# Patient Record
Sex: Female | Born: 2000 | Hispanic: Yes | Smoking: Never smoker
Health system: Southern US, Community
[De-identification: ages and names within clinical notes are randomized; demographics above are authoritative.]

## PROBLEM LIST (undated history)

## (undated) ENCOUNTER — Emergency Department (HOSPITAL_COMMUNITY): Admission: EM | Payer: Medicaid Other | Source: Home / Self Care

## (undated) DIAGNOSIS — Z789 Other specified health status: Secondary | ICD-10-CM

## (undated) HISTORY — DX: Other specified health status: Z78.9

## (undated) HISTORY — PX: WISDOM TOOTH EXTRACTION: SHX21

---
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
## 2018-03-18 NOTE — L&D Delivery Note (Signed)
Kylie Wallace is a 18 y.o. female G1P0000 with IUP at [redacted]w[redacted]d admitted for SROM.  She progressed with pitocin augmentation to complete and pushed 3 times to deliver.  Cord clamping delayed by several minutes then clamped by CNM and cut by FOB.    Delivery Note At 5:58 PM a viable female was delivered via Vaginal, Spontaneous (Presentation: LOA).  APGAR: 8, 9; weight pending .  With delivery of head, nuchal cord x2 noted, delivered through and easily reduced after delivery of body.  Placenta status: spontaneous, with trailing membranes delivered intact.  Cord: 3 vessels with the following complications: nuchal x2, marginal cord insertion.   Anesthesia: local lidocaine  Episiotomy: None Lacerations: 1st degree;Perineal Suture Repair: 3.0 vicryl Est. Blood Loss (mL): 75   Mom to postpartum.  Baby to Couplet care / Skin to Skin.  Wende Mott CNM 10/10/2018, 6:38 PM

## 2018-04-28 ENCOUNTER — Other Ambulatory Visit (HOSPITAL_COMMUNITY)
Admission: RE | Admit: 2018-04-28 | Discharge: 2018-04-28 | Disposition: A | Discharge status: Home / Self Care | Payer: Medicaid Other | Source: Ambulatory Visit | Attending: Student | Admitting: Student

## 2018-04-28 ENCOUNTER — Ambulatory Visit: Payer: Medicaid Other | Admitting: Clinical

## 2018-04-28 ENCOUNTER — Encounter: Payer: Self-pay | Admitting: *Deleted

## 2018-04-28 ENCOUNTER — Other Ambulatory Visit: Payer: Self-pay

## 2018-04-28 ENCOUNTER — Ambulatory Visit (INDEPENDENT_AMBULATORY_CARE_PROVIDER_SITE_OTHER): Payer: Medicaid Other | Admitting: *Deleted

## 2018-04-28 VITALS — BP 119/78 | HR 88 | Wt 161.5 lb

## 2018-04-28 DIAGNOSIS — Z3400 Encounter for supervision of normal first pregnancy, unspecified trimester: Secondary | ICD-10-CM | POA: Insufficient documentation

## 2018-04-28 LAB — POCT URINALYSIS DIP (DEVICE)
Bilirubin Urine: NEGATIVE
GLUCOSE, UA: NEGATIVE mg/dL
Hgb urine dipstick: NEGATIVE
Ketones, ur: NEGATIVE mg/dL
NITRITE: NEGATIVE
PH: 6.5 (ref 5.0–8.0)
PROTEIN: NEGATIVE mg/dL
Specific Gravity, Urine: <=1.005 (ref 1.005–1.030)
UROBILINOGEN UA: 0.2 mg/dL (ref 0.0–1.0)

## 2018-04-28 NOTE — Progress Notes (Signed)
Chart reviewed for nurse visit. Agree with plan of care.   Marylene Land, CNM 04/28/2018 1:55 PM

## 2018-04-28 NOTE — Progress Notes (Signed)
New Ob intake completed and pregnancy information packet given. Pt has no c/o. Labs drawn and Medication Home form completed. [place] for anatomy scheduled. Initial prenatal visit scheduled on 05/05/18.

## 2018-04-28 NOTE — BH Specialist Note (Signed)
Integrated Behavioral Health Initial Visit  MRN: [id number] Name: Kylie Wallace  Number of Integrated Behavioral Health Clinician visits:: 1/6 Session Start time: 11:50  Session End time: 12:01 Total time: 15 minutes  Type of Service: Integrated Behavioral Health- Individual/Family Interpretor:No. Interpretor Name and Language: n/a   Warm Hand Off Completed.       SUBJECTIVE: Kylie Wallace is a 18 y.o. female accompanied by n/a Patient was referred by [place] Muta Day, RN for Initial OB introduction to integrated behavioral health services . Patient reports the following symptoms/concerns: Pt states no particular concern today. Duration of problem: n/a; Severity of problem: n/a  OBJECTIVE: Mood: Normal and Affect: Appropriate Risk of harm to self or others: No plan to harm self or others  LIFE CONTEXT: Family and Social: - School/Work: - Self-Care: - Life Changes: Current pregnancy   GOALS ADDRESSED: Patient will: 1. Increase knowledge and/or ability of: healthy habits   INTERVENTIONS: Interventions utilized: Psychoeducation and/or Health Education  Standardized Assessments completed: GAD-7 and PHQ 9  ASSESSMENT: Patient currently experiencing Supervision of  Low-risk first pregnancy.   Patient may benefit from Initial OB introduction to integrated behavioral health services .  PLAN: 1. Follow up with behavioral health clinician on : As needed 2. Behavioral recommendations:  -Continue taking prenatal vitamin, as recommended by medical provider  3. Referral(s): Integrated [employer] (In Clinic) 4. "From scale of 1-10, how likely are you to follow plan?": -  Rae Lips, LCSW  Depression screen [facility] 2/9 04/28/2018  Decreased Interest 0  Down, Depressed, Hopeless 0  PHQ - 2 Score 0  Altered sleeping 0  Tired, decreased energy 1  Change in appetite 0  Feeling bad or failure about yourself  0  Trouble concentrating 0  Moving slowly or  fidgety/restless 0  Suicidal thoughts 0  PHQ-9 Score 1   GAD 7 : Generalized Anxiety Score 04/28/2018  Nervous, Anxious, on Edge 0  Control/stop worrying 0  Worry too much - different things 0  Trouble relaxing 0  Restless 0  Easily annoyed or irritable 0  Afraid - awful might happen 0  Total GAD 7 Score 0

## 2018-04-29 LAB — GC/CHLAMYDIA PROBE AMP (~~LOC~~) NOT AT ARMC
Chlamydia: NEGATIVE
NEISSERIA GONORRHEA: NEGATIVE

## 2018-04-30 LAB — CULTURE, OB URINE

## 2018-04-30 LAB — URINE CULTURE, OB REFLEX

## 2018-05-05 ENCOUNTER — Ambulatory Visit (INDEPENDENT_AMBULATORY_CARE_PROVIDER_SITE_OTHER): Payer: Medicaid Other

## 2018-05-05 ENCOUNTER — Encounter: Payer: Medicaid Other | Admitting: Student

## 2018-05-05 ENCOUNTER — Other Ambulatory Visit: Payer: Self-pay

## 2018-05-05 DIAGNOSIS — Z3402 Encounter for supervision of normal first pregnancy, second trimester: Secondary | ICD-10-CM

## 2018-05-05 DIAGNOSIS — Z3A15 15 weeks gestation of pregnancy: Secondary | ICD-10-CM

## 2018-05-05 NOTE — Patient Instructions (Signed)

## 2018-05-05 NOTE — Progress Notes (Signed)
   PRENATAL VISIT NOTE  Subjective:  Kylie Wallace is a 18 y.o. G1P0 at 102w4d who presents today for routine prenatal care.  She is currently being monitored for supervision of a low-risk pregnancy with problems as listed below.  Patient has no pregnancy related concerns.  She denies vaginal concerns and endorses intermittent N/V. She requests "a blood test" today stating she was told she would need one at her last visit.  Patient Active Problem List   Diagnosis Date Noted  . Supervision of low-risk first pregnancy 04/28/2018    The following portions of the patient's history were reviewed and updated as appropriate: allergies, current medications, past family history, past medical history, past social history, past surgical history and problem list. Problem list updated.  Objective:   Vitals:   05/05/18 1551  BP: 113/78  Pulse: (!) 102  Weight: 164 lb 12.8 oz (74.8 kg)    Fetal Status: Fetal Heart Rate (bpm): 153   Movement: Absent     General:  Alert, oriented and cooperative. Patient is in no acute distress.  Skin: Skin is warm and dry.   Cardiovascular: Regular rate and rhythm.  Respiratory: Normal respiratory effort. CTA-Bilaterally  Abdomen: Soft, gravid, appropriate for gestational age.  Pelvic: Cervical exam deferred      3FB below the umbilicus  Extremities: Normal range of motion.  Edema: None  Mental Status: Normal mood and affect. Normal behavior. Normal judgment and thought content.   Assessment and Plan:  Pregnancy: G1P0 at [redacted]w[redacted]d  1. Encounter for supervision of low-risk first pregnancy in second trimester -Anticipatory guidance for upcoming visits. -Scheduled for Anatomy US on March 13th. Discussed [place] expectations including appt arrival time and gender notification.  -Discussed AFP at next visit since patient is unsure of LMP. Patient verbalizes understanding and is agreeable. -Previous Labs pending and not reviewed -RTO in 4-5 weeks    Preterm labor  symptoms and general obstetric precautions including but not limited to vaginal bleeding, contractions, leaking of fluid and fetal movement were reviewed with the patient.  Please refer to After Visit Summary for other counseling recommendations.  Return in about 4 weeks (around 06/02/2018) for ROB.  Future Appointments  Date Time Provider Department Center  05/29/2018  1:45 PM WH-MFC [place] 2 WH-MFCUS MFC-US  06/02/2018  1:15 PM Tamera Stands, DO WOC-WOCA WOC    Cherre Robins, CNM 05/05/2018, 5:04 PM

## 2018-05-11 LAB — OBSTETRIC PANEL, INCLUDING HIV
ANTIBODY SCREEN: NEGATIVE
BASOS: 0 %
Basophils Absolute: 0 10*3/uL (ref 0.0–0.2)
EOS (ABSOLUTE): 0 10*3/uL (ref 0.0–0.4)
EOS: 0 %
HEMATOCRIT: 32.9 % — AB (ref 34.0–46.6)
HEMOGLOBIN: 11.3 g/dL (ref 11.1–15.9)
HIV SCREEN 4TH GENERATION: NONREACTIVE
Hepatitis B Surface Ag: NEGATIVE
IMMATURE GRANS (ABS): 0.1 10*3/uL (ref 0.0–0.1)
Immature Granulocytes: 1 %
Lymphocytes Absolute: 1.5 10*3/uL (ref 0.7–3.1)
Lymphs: 14 %
MCH: 29.6 pg (ref 26.6–33.0)
MCHC: 34.3 g/dL (ref 31.5–35.7)
MCV: 86 fL (ref 79–97)
MONOCYTES: 4 %
Monocytes Absolute: 0.4 10*3/uL (ref 0.1–0.9)
NEUTROS ABS: 8.9 10*3/uL — AB (ref 1.4–7.0)
Neutrophils: 81 %
Platelets: 210 10*3/uL (ref 150–450)
RBC: 3.82 x10E6/uL (ref 3.77–5.28)
RDW: 13.2 % (ref 11.7–15.4)
RH TYPE: POSITIVE
RPR: NONREACTIVE
RUBELLA: 1.44 {index} (ref >0.99)
WBC: 10.9 10*3/uL — ABNORMAL HIGH (ref 3.4–10.8)

## 2018-05-11 LAB — HEMOGLOBINOPATHY EVALUATION
FERRITIN: 18 ng/mL (ref 15–150)
HGB C: 0 %
HGB F QUANT: 2.2 % — AB (ref 0.0–2.0)
HGB SOLUBILITY: NEGATIVE
Hgb A2 Quant: 2.4 % (ref 1.8–3.2)
Hgb A: 95.4 % — ABNORMAL LOW (ref 96.4–98.8)
Hgb S: 0 %
Hgb Variant: 0 %

## 2018-05-11 LAB — INHERITEST(R) CF/SMA PANEL

## 2018-05-29 ENCOUNTER — Other Ambulatory Visit: Payer: Self-pay

## 2018-05-29 ENCOUNTER — Ambulatory Visit (HOSPITAL_COMMUNITY)
Admission: RE | Admit: 2018-05-29 | Discharge: 2018-05-29 | Disposition: A | Discharge status: Home / Self Care | Payer: Medicaid Other | Source: Ambulatory Visit | Attending: Student | Admitting: Student

## 2018-05-29 ENCOUNTER — Other Ambulatory Visit (HOSPITAL_COMMUNITY): Payer: Self-pay | Admitting: *Deleted

## 2018-05-29 DIAGNOSIS — Z3A19 19 weeks gestation of pregnancy: Secondary | ICD-10-CM

## 2018-05-29 DIAGNOSIS — Z3687 Encounter for antenatal screening for uncertain dates: Secondary | ICD-10-CM

## 2018-05-29 DIAGNOSIS — Z363 Encounter for antenatal screening for malformations: Secondary | ICD-10-CM | POA: Diagnosis not present

## 2018-05-29 DIAGNOSIS — Z3400 Encounter for supervision of normal first pregnancy, unspecified trimester: Secondary | ICD-10-CM | POA: Diagnosis not present

## 2018-05-29 DIAGNOSIS — Z362 Encounter for other antenatal screening follow-up: Secondary | ICD-10-CM

## 2018-06-01 ENCOUNTER — Telehealth: Payer: Self-pay | Admitting: Family Medicine

## 2018-06-01 NOTE — Telephone Encounter (Signed)
Called patient to inform about the restrictions at the office due to the coronavirus. Patient verbalized understanding. 

## 2018-06-02 ENCOUNTER — Other Ambulatory Visit: Payer: Self-pay

## 2018-06-02 ENCOUNTER — Ambulatory Visit (INDEPENDENT_AMBULATORY_CARE_PROVIDER_SITE_OTHER): Payer: Medicaid Other | Admitting: Family Medicine

## 2018-06-02 VITALS — BP 111/74 | HR 97 | Temp 98.4°F | Wt 165.0 lb

## 2018-06-02 DIAGNOSIS — Z3A20 20 weeks gestation of pregnancy: Secondary | ICD-10-CM

## 2018-06-02 DIAGNOSIS — Z3402 Encounter for supervision of normal first pregnancy, second trimester: Secondary | ICD-10-CM

## 2018-06-02 NOTE — Patient Instructions (Signed)
Check out www.bedsider.org for information on different types of birth control

## 2018-06-02 NOTE — Progress Notes (Signed)
   PRENATAL VISIT NOTE Subjective:  Kylie Wallace is a 18 y.o. G1P0 at [redacted]w[redacted]d being seen today for ongoing prenatal care.  She is currently monitored for the following issues for this low-risk pregnancy and has Supervision of low-risk first pregnancy on their problem list.  Patient reports no complaints.  Contractions: Not present. Vag. Bleeding: None.  Movement: Present. Denies leaking of fluid.   The following portions of the patient's history were reviewed and updated as appropriate: allergies, current medications, past family history, past medical history, past social history, past surgical history and problem list.   Objective:   Vitals:   06/02/18 1318  BP: 111/74  Pulse: 97  Temp: 98.4 F (36.9 C)  Weight: 165 lb (74.8 kg)    Fetal Status: Fetal Heart Rate (bpm): 159   Movement: Present     General:  Alert, oriented and cooperative. Patient is in no acute distress.  Skin: Skin is warm and dry. No rash noted.   Cardiovascular: Normal heart rate noted  Respiratory: Normal respiratory effort, no problems with respiration noted  Abdomen: Soft, gravid, appropriate for gestational age.  Pain/Pressure: Absent     Pelvic: Cervical exam deferred        Extremities: Normal range of motion.  Edema: None  Mental Status: Normal mood and affect. Normal behavior. Normal judgment and thought content.   Assessment and Plan:  Pregnancy: G1P0 at [redacted]w[redacted]d here for routine prenatal visit.   Supervision of Pregnancy -- prenatal record reviewed and UTD -- has follow-up anatomy U/S scheduled 4/10 for completion -- counseling regarding contraception -- 28-week labs ordered for future encounter   Preterm labor symptoms and general obstetric precautions including but not limited to vaginal bleeding, contractions, leaking of fluid and fetal movement were reviewed in detail with the patient. Please refer to After Visit Summary for other counseling recommendations.   Return in about 8 weeks (around  07/28/2018) for ROB. Discussed okay to return later because of COVID-19 virus and patient/partner concerns regarding risk of transmission. Discussed would need GTT and other labs at that visit.   Future Appointments  Date Time Provider Department Center  06/26/2018  3:30 PM [facility] NURSE [facility] M[facility]-US  06/26/2018  3:30 PM WH-MFC [place] 5 WH-MFCUS MFC-US   Tamera Stands, DO

## 2018-06-09 ENCOUNTER — Encounter: Payer: Self-pay | Admitting: *Deleted

## 2018-06-26 ENCOUNTER — Ambulatory Visit (HOSPITAL_COMMUNITY): Payer: Medicaid Other

## 2018-07-13 ENCOUNTER — Telehealth: Payer: Self-pay | Admitting: *Deleted

## 2018-07-13 ENCOUNTER — Other Ambulatory Visit: Payer: Self-pay | Admitting: *Deleted

## 2018-07-13 DIAGNOSIS — Z3400 Encounter for supervision of normal first pregnancy, unspecified trimester: Secondary | ICD-10-CM

## 2018-07-13 NOTE — Telephone Encounter (Signed)
I called Kylie Wallace and reminded her of her upcoming virtual visit tomorrow. I assisted her with locating, downloading Webex app. I also sent her Babyscripts optimization email and assisted her with downloading app and ordering Mommy Kit. I explained she should get bp kit in a week or so and to let us know if she does not get the kit. I also explained she should take her blood pressure weekly and all [place] if it is 140/ or higher or DBP 90 or higher to repeat in 5 minutes and call us. I also instructed her if is 160/100 or either number to go to hospital for evaluation. I explained what to expect tomorrow for virtual visit. She voices understanding.

## 2018-07-14 ENCOUNTER — Other Ambulatory Visit: Payer: Self-pay

## 2018-07-14 ENCOUNTER — Encounter: Payer: Medicaid Other | Admitting: Internal Medicine

## 2018-07-14 ENCOUNTER — Ambulatory Visit (INDEPENDENT_AMBULATORY_CARE_PROVIDER_SITE_OTHER): Payer: Medicaid Other

## 2018-07-14 DIAGNOSIS — Z3402 Encounter for supervision of normal first pregnancy, second trimester: Secondary | ICD-10-CM

## 2018-07-14 DIAGNOSIS — Z3400 Encounter for supervision of normal first pregnancy, unspecified trimester: Secondary | ICD-10-CM

## 2018-07-14 DIAGNOSIS — Z3A26 26 weeks gestation of pregnancy: Secondary | ICD-10-CM

## 2018-07-14 NOTE — Progress Notes (Signed)
   TELEHEALTH VIRTUAL OBSTETRICS VISIT ENCOUNTER NOTE  I connected with Kylie Wallace on 07/14/18 at  1:15 PM EDT by WebEx at home and verified that I am speaking with the correct person using two identifiers.   I discussed the limitations, risks, security and privacy concerns of performing an evaluation and management service by telephone and the availability of in person appointments. I also discussed with the patient that there may be a patient responsible charge related to this service. The patient expressed understanding and agreed to proceed.  Subjective:  Kylie Wallace is a 18 y.o. G1P0 at [redacted]w[redacted]d being followed for ongoing prenatal care.  She is currently monitored for the following issues for this low-risk pregnancy and has Supervision of low-risk first pregnancy on their problem list.  Patient reports hair loss. Se states she is seeing more hair coming out in the shower and when she brushes it. Denies any chunks of hair or bald spots. Reports fetal movement. Denies any contractions, bleeding or leaking of fluid.   The following portions of the patient's history were reviewed and updated as appropriate: allergies, current medications, past family history, past medical history, past social history, past surgical history and problem list.   Objective:   General:  Alert, oriented and cooperative.   Mental Status: Normal mood and affect perceived. Normal judgment and thought content.  Rest of physical exam deferred due to type of encounter  Assessment and Plan:  Pregnancy: G1P0 at [redacted]w[redacted]d 1. Encounter for supervision of low-risk first pregnancy, antepartum -Patient waiting of cuff from baby RX, signed up 4/27 -Discussed hormonal changes to hair in pregnancy and reassured of normalcy. -Lengthy discussion of birth control options, patient considering breast feeding and reviewed progesterone only methods -Anticipatory guidance of next visit with GTT given to patient  Preterm labor  symptoms and general obstetric precautions including but not limited to vaginal bleeding, contractions, leaking of fluid and fetal movement were reviewed in detail with the patient.  I discussed the assessment and treatment plan with the patient. The patient was provided an opportunity to ask questions and all were answered. The patient agreed with the plan and demonstrated an understanding of the instructions. The patient was advised to call back or seek an in-person office evaluation/go to [facility] at [facility] for any urgent or concerning symptoms. Please refer to After Visit Summary for other counseling recommendations.   I provided 10 minutes of non-face-to-face time during this encounter.  Return in about 2 weeks (around 07/28/2018) for Return OB visit.  Future Appointments  Date Time Provider Department Center  07/24/2018  3:30 PM WH-MFC NURSE WH-MFC MFC-US  07/24/2018  3:30 PM WH-MFC [place] 1 WH-MFCUS MFC-US    Rolm Bookbinder, CNM Center for [employer], [facility]

## 2018-07-14 NOTE — Progress Notes (Signed)
I connected with  Kylie Wallace on 07/14/18 at  1:15 PM EDT by telephone and verified that I am speaking with the correct person using two identifiers.   I discussed the limitations, risks, security and privacy concerns of performing an evaluation and management service by telephone and the availability of in person appointments. I also discussed with the patient that there may be a patient responsible charge related to this service. The patient expressed understanding and agreed to proceed.  Kylie Human, RN 07/14/2018  1:07 PM  Pt signed up for babyscripts on 07/13/18.  Advised pt that it can take a week for her BP cuff to come in but, if it doesn't, then she needs to contact the office.

## 2018-07-16 NOTE — Telephone Encounter (Signed)
Opened in error

## 2018-07-24 ENCOUNTER — Encounter (HOSPITAL_COMMUNITY): Payer: Self-pay

## 2018-07-24 ENCOUNTER — Ambulatory Visit (HOSPITAL_COMMUNITY)
Admission: RE | Admit: 2018-07-24 | Discharge: 2018-07-24 | Disposition: A | Discharge status: Home / Self Care | Payer: Medicaid Other | Source: Ambulatory Visit | Attending: Obstetrics and Gynecology | Admitting: Obstetrics and Gynecology

## 2018-07-24 ENCOUNTER — Other Ambulatory Visit: Payer: Self-pay

## 2018-07-24 ENCOUNTER — Ambulatory Visit (HOSPITAL_COMMUNITY): Payer: Medicaid Other

## 2018-07-24 DIAGNOSIS — Z3A27 27 weeks gestation of pregnancy: Secondary | ICD-10-CM

## 2018-07-24 DIAGNOSIS — Z362 Encounter for other antenatal screening follow-up: Secondary | ICD-10-CM | POA: Diagnosis not present

## 2018-07-27 ENCOUNTER — Telehealth: Payer: Self-pay | Admitting: Obstetrics & Gynecology

## 2018-07-27 NOTE — Telephone Encounter (Signed)
Called the patient to confirm the appointment, the patient verbalized understating.    Informed of fasting the night before.

## 2018-07-30 ENCOUNTER — Other Ambulatory Visit: Payer: Self-pay

## 2018-07-30 ENCOUNTER — Encounter: Payer: Self-pay | Admitting: Obstetrics and Gynecology

## 2018-07-30 ENCOUNTER — Other Ambulatory Visit: Payer: Medicaid Other

## 2018-07-30 ENCOUNTER — Ambulatory Visit (INDEPENDENT_AMBULATORY_CARE_PROVIDER_SITE_OTHER): Payer: Medicaid Other | Admitting: Obstetrics and Gynecology

## 2018-07-30 VITALS — BP 117/64 | HR 90 | Temp 98.4°F | Wt 181.0 lb

## 2018-07-30 DIAGNOSIS — Z3403 Encounter for supervision of normal first pregnancy, third trimester: Secondary | ICD-10-CM | POA: Diagnosis present

## 2018-07-30 DIAGNOSIS — Z3A28 28 weeks gestation of pregnancy: Secondary | ICD-10-CM | POA: Diagnosis not present

## 2018-07-30 DIAGNOSIS — Z3402 Encounter for supervision of normal first pregnancy, second trimester: Secondary | ICD-10-CM

## 2018-07-30 DIAGNOSIS — Z3400 Encounter for supervision of normal first pregnancy, unspecified trimester: Secondary | ICD-10-CM

## 2018-07-30 DIAGNOSIS — Z23 Encounter for immunization: Secondary | ICD-10-CM | POA: Diagnosis not present

## 2018-07-30 NOTE — Progress Notes (Signed)
   PRENATAL VISIT NOTE  Subjective:  Kylie Wallace is a 18 y.o. G1P0 at [redacted]w[redacted]d being seen today for ongoing prenatal care.  She is currently monitored for the following issues for this low-risk pregnancy and has Supervision of low-risk first pregnancy on their problem list.  Patient reports no complaints.  Contractions: Not present. Vag. Bleeding: None.  Movement: Present. Denies leaking of fluid.   The following portions of the patient's history were reviewed and updated as appropriate: allergies, current medications, past family history, past medical history, past social history, past surgical history and problem list.   Objective:   Vitals:   07/30/18 0939  BP: 117/64  Pulse: 90  Temp: 98.4 F (36.9 C)  Weight: 181 lb (82.1 kg)    Fetal Status: Fetal Heart Rate (bpm): 138   Movement: Present     General:  Alert, oriented and cooperative. Patient is in no acute distress.  Skin: Skin is warm and dry. No rash noted.   Cardiovascular: Normal heart rate noted  Respiratory: Normal respiratory effort, no problems with respiration noted  Abdomen: Soft, gravid, appropriate for gestational age.  Pain/Pressure: Absent     Pelvic: Cervical exam deferred        Extremities: Normal range of motion.  Edema: None  Mental Status: Normal mood and affect. Normal behavior. Normal judgment and thought content.   Assessment and Plan:  Pregnancy: G1P0 at [redacted]w[redacted]d 1. Encounter for supervision of low-risk first pregnancy, antepartum Patient is doing well without complaints Third trimester labs today Patient remains undecided on contraception and peds - Tdap vaccine greater than or equal to 7yo IM  Preterm labor symptoms and general obstetric precautions including but not limited to vaginal bleeding, contractions, leaking of fluid and fetal movement were reviewed in detail with the patient. Please refer to After Visit Summary for other counseling recommendations.   Return in about 2 weeks (around  08/13/2018) for [facility], ROB.  Future Appointments  Date Time Provider Department Center  07/30/2018 10:55 AM Leyana Whidden, Gigi Gin, MD [facility] WOC    Catalina Antigua, MD

## 2018-07-30 NOTE — Progress Notes (Signed)
na

## 2018-07-30 NOTE — Patient Instructions (Signed)
[facility] [address] [place] [phone]  [facility] [address] [place] [phone] [facility] [address] [phone]  [facility] Main: [address] [phone] [place]: [address] [phone]  [facility] [address], [place] [phone]  [facility] [address], [place] [phone]  [facility] [address], [place] [phone]  [facility] [place]: [address], [address] [phone] [place]: [address], [address] [phone] [place]: [address], [address] [phone] [place]: [address], [address] [phone]  [facility] [address], [place] [phone]  [facility] [address], [address] [phone]  [facility] [address] [phone]  [facility] [address], [place] [phone]  [facility] [address], [place] [phone]  [facility] [address], [place] [phone]  [facility] [address], [place] [phone]  [facility] @ [place]: [address], [place] [phone] FM @ [place]: [address], [place] [phone] Peds @ [place]: [address], [place] [phone] Peds @ [place]: [address], [place] [phone]  [facility] [address], [place] [phone]  [facility] [address], [place] [phone]  Private Pediatrician Jack Amos, MD [address], [place] [phone] Shilpa Gosrani, MD [address], [place] [phone] David Rubin, MD [address] [phone] Nnaemeka-Okoyeh, MD ([facility]) [address], [place] [phone]        

## 2018-07-31 LAB — CBC
Hematocrit: 31.6 % — ABNORMAL LOW (ref 34.0–46.6)
Hemoglobin: 10.8 g/dL — ABNORMAL LOW (ref 11.1–15.9)
MCH: 28.7 pg (ref 26.6–33.0)
MCHC: 34.2 g/dL (ref 31.5–35.7)
MCV: 84 fL (ref 79–97)
Platelets: 232 10*3/uL (ref 150–450)
RBC: 3.76 x10E6/uL — ABNORMAL LOW (ref 3.77–5.28)
RDW: 13.3 % (ref 11.7–15.4)
WBC: 9.8 10*3/uL (ref 3.4–10.8)

## 2018-07-31 LAB — GLUCOSE TOLERANCE, 2 HOURS W/ 1HR
Glucose, 1 hour: 107 mg/dL (ref 65–179)
Glucose, 2 hour: 133 mg/dL (ref 65–152)
Glucose, Fasting: 66 mg/dL (ref 65–91)

## 2018-07-31 LAB — RPR: RPR Ser Ql: NONREACTIVE

## 2018-07-31 LAB — HIV ANTIBODY (ROUTINE TESTING W REFLEX): HIV Screen 4th Generation wRfx: NONREACTIVE

## 2018-08-13 ENCOUNTER — Telehealth (INDEPENDENT_AMBULATORY_CARE_PROVIDER_SITE_OTHER): Payer: Medicaid Other | Admitting: Obstetrics and Gynecology

## 2018-08-13 ENCOUNTER — Other Ambulatory Visit: Payer: Self-pay

## 2018-08-13 ENCOUNTER — Encounter: Payer: Self-pay | Admitting: Obstetrics and Gynecology

## 2018-08-13 DIAGNOSIS — Z3403 Encounter for supervision of normal first pregnancy, third trimester: Secondary | ICD-10-CM

## 2018-08-13 DIAGNOSIS — Z3400 Encounter for supervision of normal first pregnancy, unspecified trimester: Secondary | ICD-10-CM

## 2018-08-13 NOTE — Progress Notes (Signed)
   TELEHEALTH VIRTUAL OBSTETRICS VISIT ENCOUNTER NOTE  I connected with Kylie Wallace on 08/13/18 at  3:15 PM EDT by mychart video at home and verified that I am speaking with the correct person using two identifiers.   I discussed the limitations, risks, security and privacy concerns of performing an evaluation and management service by telephone and the availability of in person appointments. I also discussed with the patient that there may be a patient responsible charge related to this service. The patient expressed understanding and agreed to proceed.  Subjective:  Kylie Wallace is a 18 y.o. G1P0 at [redacted]w[redacted]d being followed for ongoing prenatal care.  She is currently monitored for the following issues for this low-risk pregnancy and has Supervision of low-risk first pregnancy on their problem list.  Patient reports no complaints. Reports fetal movement. Denies any contractions, bleeding or leaking of fluid.   The following portions of the patient's history were reviewed and updated as appropriate: allergies, current medications, past family history, past medical history, past social history, past surgical history and problem list.   Objective:   General:  Alert, oriented and cooperative.   Mental Status: Normal mood and affect perceived. Normal judgment and thought content.  Rest of physical exam deferred due to type of encounter  Assessment and Plan:  Pregnancy: G1P0 at [redacted]w[redacted]d 1. Encounter for supervision of low-risk first pregnancy, antepartum  Discussed glucose testing Encouraged more iron rich foods in diet  Recent F/U ultrasound reviewed with patient. Patient was at the store during her virtual visit, no BP cuff with her. She will check her BP when she arrives home and upload it into [employer].   Preterm labor symptoms and general obstetric precautions including but not limited to vaginal bleeding, contractions, leaking of fluid and fetal movement were reviewed in detail with the  patient.  I discussed the assessment and treatment plan with the patient. The patient was provided an opportunity to ask questions and all were answered. The patient agreed with the plan and demonstrated an understanding of the instructions. The patient was advised to call back or seek an in-person office evaluation/go to MAU at [facility] for any urgent or concerning symptoms. Please refer to After Visit Summary for other counseling recommendations.   I provided 10 minutes of non-face-to-face time during this encounter.  Return in about 2 weeks (around 08/27/2018) for For virtual visit .  No future appointments.  Venia Carbon, NP Center for [employer], [facility]

## 2018-08-13 NOTE — Patient Instructions (Signed)

## 2018-08-13 NOTE — Progress Notes (Signed)
I connected with  [name] on 08/13/18 at  3:15 PM EDT by telephone and verified that I am speaking with the correct person using two identifiers.   I discussed the limitations, risks, security and privacy concerns of performing an evaluation and management service by telephone and the availability of in person appointments. I also discussed with the patient that there may be a patient responsible charge related to this service. The patient expressed understanding and agreed to proceed.  Janene Madeira Marvell Stavola, CMA 08/13/2018  3:09 PM

## 2018-08-25 ENCOUNTER — Telehealth: Payer: Self-pay

## 2018-08-25 ENCOUNTER — Ambulatory Visit: Payer: Medicaid Other | Admitting: Pediatrics

## 2018-08-25 ENCOUNTER — Other Ambulatory Visit: Payer: Self-pay

## 2018-08-25 DIAGNOSIS — Z7681 Expectant parent(s) prebirth pediatrician visit: Secondary | ICD-10-CM

## 2018-08-25 NOTE — Telephone Encounter (Signed)
Called the patient to confirm the upcoming appointment. The patient verbalized understanding and stated she is aware of how to operate mychart. °

## 2018-08-25 NOTE — Progress Notes (Signed)
Prenatal counseling for impending newborn done-- 1st child, currently 11HER, no complications, 6-7 weeks prenatal care.  Z76.81

## 2018-08-27 ENCOUNTER — Telehealth (INDEPENDENT_AMBULATORY_CARE_PROVIDER_SITE_OTHER): Payer: Medicaid Other | Admitting: Obstetrics and Gynecology

## 2018-08-27 ENCOUNTER — Other Ambulatory Visit: Payer: Self-pay

## 2018-08-27 VITALS — BP 105/56 | HR 83

## 2018-08-27 DIAGNOSIS — Z3A32 32 weeks gestation of pregnancy: Secondary | ICD-10-CM

## 2018-08-27 DIAGNOSIS — Z3403 Encounter for supervision of normal first pregnancy, third trimester: Secondary | ICD-10-CM

## 2018-08-27 NOTE — Progress Notes (Signed)
   TELEHEALTH VIRTUAL OBSTETRICS VISIT ENCOUNTER NOTE  I connected with Kylie Wallace on 08/27/18 at 10:55 AM EDT by telephone at home and verified that I am speaking with the correct person using two identifiers.   I discussed the limitations, risks, security and privacy concerns of performing an evaluation and management service by telephone and the availability of in person appointments. I also discussed with the patient that there may be a patient responsible charge related to this service. The patient expressed understanding and agreed to proceed.  Subjective:  Kylie Wallace is a 18 y.o. G1P0 at [redacted]w[redacted]d being followed for ongoing prenatal care.  She is currently monitored for the following issues for this low-risk pregnancy and has Supervision of low-risk first pregnancy on their problem list.  Patient reports no complaints. Reports fetal movement. Denies any contractions, bleeding or leaking of fluid.   The following portions of the patient's history were reviewed and updated as appropriate: allergies, current medications, past family history, past medical history, past social history, past surgical history and problem list.   Objective:   General:  Alert, oriented and cooperative.   Mental Status: Normal mood and affect perceived. Normal judgment and thought content.  Rest of physical exam deferred due to type of encounter  Assessment and Plan:  Pregnancy: G1P0 at [redacted]w[redacted]d 1. Encounter for supervision of low-risk first pregnancy in third trimester  BP 105/66 today + fetal movement  Discussed circumcision  WIC information given    Preterm labor symptoms and general obstetric precautions including but not limited to vaginal bleeding, contractions, leaking of fluid and fetal movement were reviewed in detail with the patient.  I discussed the assessment and treatment plan with the patient. The patient was provided an opportunity to ask questions and all were answered. The patient  agreed with the plan and demonstrated an understanding of the instructions. The patient was advised to call back or seek an in-person office evaluation/go to MAU at [facility] for any urgent or concerning symptoms. Please refer to After Visit Summary for other counseling recommendations.   I provided 10 minutes of non-face-to-face time during this encounter.  Return in about 2 weeks (around 09/10/2018), or Virtual visit ok.  No future appointments.  Noni Saupe, NP Center for [employer], [place]

## 2018-08-27 NOTE — Patient Instructions (Signed)

## 2018-08-27 NOTE — Progress Notes (Signed)
I connected with  Jordan Likes on 08/27/18 at 10:55 AM EDT by telephone and verified that I am speaking with the correct person using two identifiers.   I discussed the limitations, risks, security and privacy concerns of performing an evaluation and management service by telephone and the availability of in person appointments. I also discussed with the patient that there may be a patient responsible charge related to this service. The patient expressed understanding and agreed to proceed.  Derinda Late, RN 08/27/2018  10:42 AM

## 2018-09-04 ENCOUNTER — Telehealth: Payer: Self-pay | Admitting: Obstetrics & Gynecology

## 2018-09-04 NOTE — Telephone Encounter (Signed)
Patient was called and instructed about her visit for 09/07/2018.

## 2018-09-07 ENCOUNTER — Telehealth: Payer: Medicaid Other

## 2018-09-07 ENCOUNTER — Encounter: Payer: Self-pay | Admitting: Advanced Practice Midwife

## 2018-09-07 ENCOUNTER — Telehealth (INDEPENDENT_AMBULATORY_CARE_PROVIDER_SITE_OTHER): Payer: Medicaid Other | Admitting: Advanced Practice Midwife

## 2018-09-07 ENCOUNTER — Other Ambulatory Visit: Payer: Self-pay

## 2018-09-07 VITALS — BP 117/71

## 2018-09-07 DIAGNOSIS — Z3403 Encounter for supervision of normal first pregnancy, third trimester: Secondary | ICD-10-CM

## 2018-09-07 DIAGNOSIS — Z3A34 34 weeks gestation of pregnancy: Secondary | ICD-10-CM

## 2018-09-07 DIAGNOSIS — Z3483 Encounter for supervision of other normal pregnancy, third trimester: Secondary | ICD-10-CM | POA: Diagnosis not present

## 2018-09-07 DIAGNOSIS — Z3480 Encounter for supervision of other normal pregnancy, unspecified trimester: Secondary | ICD-10-CM

## 2018-09-07 NOTE — Progress Notes (Signed)
   TELEHEALTH VIRTUAL OBSTETRICS VISIT ENCOUNTER NOTE  I connected with Kylie Wallace on 09/07/18 at  3:15 PM EDT by telephone at home and verified that I am speaking with the correct person using two identifiers.   I discussed the limitations, risks, security and privacy concerns of performing an evaluation and management service by telephone and the availability of in person appointments. I also discussed with the patient that there may be a patient responsible charge related to this service. The patient expressed understanding and agreed to proceed.  Subjective:  Kylie Wallace is a 18 y.o. G1P0 at [redacted]w[redacted]d being followed for ongoing prenatal care.  She is currently monitored for the following issues for this low-risk pregnancy and has Supervision of low-risk first pregnancy on their problem list.  Patient reports no complaints. Reports fetal movement. Denies any contractions, bleeding or leaking of fluid.   The following portions of the patient's history were reviewed and updated as appropriate: allergies, current medications, past family history, past medical history, past social history, past surgical history and problem list.   Objective:   General:  Alert, oriented and cooperative.   Mental Status: Normal mood and affect perceived. Normal judgment and thought content.  Rest of physical exam deferred due to type of encounter  Assessment and Plan:  Pregnancy: G1P0 at [redacted]w[redacted]d 1. Encounter for supervision of low-risk first pregnancy in third trimester - Routine care - FU in 2 weeks for in person visit with [facility]  - Discussed BC options and gave patient info to look over. We will discuss again at next visit   Preterm labor symptoms and general obstetric precautions including but not limited to vaginal bleeding, contractions, leaking of fluid and fetal movement were reviewed in detail with the patient.  I discussed the assessment and treatment plan with the patient. The patient was provided  an opportunity to ask questions and all were answered. The patient agreed with the plan and demonstrated an understanding of the instructions. The patient was advised to call back or seek an in-person office evaluation/go to MAU at [facility] for any urgent or concerning symptoms. Please refer to After Visit Summary for other counseling recommendations.   I provided 15 minutes of non-face-to-face time during this encounter.  Return in about 2 weeks (around 09/21/2018) for In person visit for 36 week labs.  Future Appointments  Date Time Provider [place]  09/07/2018  3:15 PM Tresea Mall, CNM [facility]    .Marcille Buffy DNP, CNM  09/07/18  3:14 PM  Center for [facility]

## 2018-09-07 NOTE — Progress Notes (Signed)
I connected with  Kylie Wallace on 09/07/18 at  3:15 PM EDT by telephone and verified that I am speaking with the correct person using two identifiers.   I discussed the limitations, risks, security and privacy concerns of performing an evaluation and management service by telephone and the availability of in person appointments. I also discussed with the patient that there may be a patient responsible charge related to this service. The patient expressed understanding and agreed to proceed.  [place], CMA 09/07/2018  3:12 PM

## 2018-09-07 NOTE — Patient Instructions (Signed)
Contraception Choices Contraception, also called birth control, refers to methods or devices that prevent pregnancy. Hormonal methods Contraceptive implant (This can be done in the hospital prior to you going home) A contraceptive implant is a thin, plastic tube that contains a hormone. It is inserted into the upper part of the arm. It can remain in place for up to 3 years. Progestin-only injections Progestin-only injections are injections of progestin, a synthetic form of the hormone progesterone. They are given every 3 months by a health care provider. Birth control pills  Birth control pills are pills that contain hormones that prevent pregnancy. They must be taken once a day, preferably at the same time each day. Birth control patch  The birth control patch contains hormones that prevent pregnancy. It is placed on the skin and must be changed once a week for three weeks and removed on the fourth week. A prescription is needed to use this method of contraception. Vaginal ring  A vaginal ring contains hormones that prevent pregnancy. It is placed in the vagina for three weeks and removed on the fourth week. After that, the process is repeated with a new ring. A prescription is needed to use this method of contraception. Emergency contraceptive Emergency contraceptives prevent pregnancy after unprotected sex. They come in pill form and can be taken up to 5 days after sex. They work best the sooner they are taken after having sex. Most emergency contraceptives are available without a prescription. This method should not be used as your only form of birth control. Barrier methods Female condom  A female condom is a thin sheath that is worn over the penis during sex. Condoms keep sperm from going inside a woman's body. They can be used with a spermicide to increase their effectiveness. They should be disposed after a single use. Female condom  A female condom is a soft, loose-fitting sheath that  is put into the vagina before sex. The condom keeps sperm from going inside a woman's body. They should be disposed after a single use. Diaphragm  A diaphragm is a soft, dome-shaped barrier. It is inserted into the vagina before sex, along with a spermicide. The diaphragm blocks sperm from entering the uterus, and the spermicide kills sperm. A diaphragm should be left in the vagina for 6-8 hours after sex and removed within 24 hours. A diaphragm is prescribed and fitted by a health care provider. A diaphragm should be replaced every 1-2 years, after giving birth, after gaining more than 15 lb (6.8 kg), and after pelvic surgery. Cervical cap  A cervical cap is a round, soft latex or plastic cup that fits over the cervix. It is inserted into the vagina before sex, along with spermicide. It blocks sperm from entering the uterus. The cap should be left in place for 6-8 hours after sex and removed within 48 hours. A cervical cap must be prescribed and fitted by a health care provider. It should be replaced every 2 years. Sponge  A sponge is a soft, circular piece of polyurethane foam with spermicide on it. The sponge helps block sperm from entering the uterus, and the spermicide kills sperm. To use it, you make it wet and then insert it into the vagina. It should be inserted before sex, left in for at least 6 hours after sex, and removed and thrown away within 30 hours. Spermicides Spermicides are chemicals that kill or block sperm from entering the cervix and uterus. They can come as a cream, jelly,  suppository, foam, or tablet. A spermicide should be inserted into the vagina with an applicator at least 44-31 minutes before sex to allow time for it to work. The process must be repeated every time you have sex. Spermicides do not require a prescription. Intrauterine contraception Intrauterine device (IUD) An IUD is a T-shaped device that is put in a woman's uterus. There are two types:  Hormone IUD.This  type contains progestin, a synthetic form of the hormone progesterone. This type can stay in place for 3-5 years.  Copper IUD.This type is wrapped in copper wire. It can stay in place for 10 years.  (These can be done in the hospital right after the baby is born) Permanent methods of contraception Female tubal ligation In this method, a woman's fallopian tubes are sealed, tied, or blocked during surgery to prevent eggs from traveling to the uterus. Hysteroscopic sterilization In this method, a small, flexible insert is placed into each fallopian tube. The inserts cause scar tissue to form in the fallopian tubes and block them, so sperm cannot reach an egg. The procedure takes about 3 months to be effective. Another form of birth control must be used during those 3 months. Female sterilization This is a procedure to tie off the tubes that carry sperm (vasectomy). After the procedure, the man can still ejaculate fluid (semen). Natural planning methods Natural family planning In this method, a couple does not have sex on days when the woman could become pregnant. Calendar method This means keeping track of the length of each menstrual cycle, identifying the days when pregnancy can happen, and not having sex on those days. Ovulation method In this method, a couple avoids sex during ovulation. Symptothermal method This method involves not having sex during ovulation. The woman typically checks for ovulation by watching changes in her temperature and in the consistency of cervical mucus. Post-ovulation method In this method, a couple waits to have sex until after ovulation. Summary  Contraception, also called birth control, means methods or devices that prevent pregnancy.  Hormonal methods of contraception include implants, injections, pills, patches, vaginal rings, and emergency contraceptives.  Barrier methods of contraception can include female condoms, female condoms, diaphragms, cervical  caps, sponges, and spermicides.  There are two types of IUDs (intrauterine devices). An IUD can be put in a woman's uterus to prevent pregnancy for 3-5 years.  Permanent sterilization can be done through a procedure for males, females, or both.  Natural family planning methods involve not having sex on days when the woman could become pregnant. This information is not intended to replace advice given to you by your health care provider. Make sure you discuss any questions you have with your health care provider. Document Released: 03/04/2005 Document Revised: 03/06/2017 Document Reviewed: 04/06/2016 Elsevier Interactive Patient Education  2019 [employer].

## 2018-09-09 ENCOUNTER — Telehealth: Payer: Medicaid Other | Admitting: Medical

## 2018-09-21 ENCOUNTER — Telehealth: Payer: Self-pay | Admitting: Family Medicine

## 2018-09-21 NOTE — Telephone Encounter (Signed)
Patient called in stating she needs to schedule an appointment for her 36wk. Patient was scheduled for 7/8 @ 1:55. Per [employer] can now have f61f appointments as well. Patient was screened for symptoms and denied having any. Patient was instructed to wear a face mask and no visitors are allowed.

## 2018-09-23 ENCOUNTER — Ambulatory Visit (INDEPENDENT_AMBULATORY_CARE_PROVIDER_SITE_OTHER): Payer: Medicaid Other | Admitting: Nurse Practitioner

## 2018-09-23 ENCOUNTER — Other Ambulatory Visit: Payer: Self-pay

## 2018-09-23 ENCOUNTER — Other Ambulatory Visit (HOSPITAL_COMMUNITY)
Admission: RE | Admit: 2018-09-23 | Discharge: 2018-09-23 | Disposition: A | Discharge status: Home / Self Care | Payer: Medicaid Other | Source: Ambulatory Visit | Attending: Nurse Practitioner | Admitting: Nurse Practitioner

## 2018-09-23 VITALS — BP 108/73 | HR 97 | Wt 195.0 lb

## 2018-09-23 DIAGNOSIS — Z3403 Encounter for supervision of normal first pregnancy, third trimester: Secondary | ICD-10-CM

## 2018-09-23 DIAGNOSIS — Z3A36 36 weeks gestation of pregnancy: Secondary | ICD-10-CM

## 2018-09-23 NOTE — Patient Instructions (Signed)

## 2018-09-23 NOTE — Progress Notes (Signed)
    Subjective:  Kylie Wallace is a 18 y.o. G1P0 at [redacted]w[redacted]d being seen today for ongoing prenatal care.  She is currently monitored for the following issues for this low-risk pregnancy and has Supervision of low-risk first pregnancy on their problem list.  Patient reports no complaints.  Contractions: Irritability. Vag. Bleeding: None.  Movement: Present. Denies leaking of fluid.   The following portions of the patient's history were reviewed and updated as appropriate: allergies, current medications, past family history, past medical history, past social history, past surgical history and problem list. Problem list updated.  Objective:   Vitals:   09/23/18 1419  BP: 108/73  Pulse: 97  Weight: 195 lb (88.5 kg)    Fetal Status: Fetal Heart Rate (bpm): 130 Fundal Height: 36 cm Movement: Present  Presentation: Vertex  General:  Alert, oriented and cooperative. Patient is in no acute distress.  Skin: Skin is warm and dry. No rash noted.   Cardiovascular: Normal heart rate noted  Respiratory: Normal respiratory effort, no problems with respiration noted  Abdomen: Soft, gravid, appropriate for gestational age. Pain/Pressure: Absent     Pelvic:  Cervical exam performed Dilation: Closed   Station: -3 Cervix is posterior  Extremities: Normal range of motion.  Edema: Trace  Mental Status: Normal mood and affect. Normal behavior. Normal judgment and thought content.   Urinalysis:      Assessment and Plan:  Pregnancy: G1P0 at [redacted]w[redacted]d  1. Encounter for supervision of low-risk first pregnancy in third trimester Reviewed entrance at hospital to use for labor. Baby is moving well. Swabs for infection done.  - Culture, beta strep (group b only) - GC/Chlamydia probe amp ([place])not at [facility]  Preterm labor symptoms and general obstetric precautions including but not limited to vaginal bleeding, contractions, leaking of fluid and fetal movement were reviewed in detail with the patient. Please  refer to After Visit Summary for other counseling recommendations.  Return in about 1 week (around 09/30/2018) for video visit.  Earlie Server, RN, MSN, NP-BC Nurse Practitioner, [facility] for [employer], [place] [facility] 09/23/2018 3:09 PM

## 2018-09-25 LAB — GC/CHLAMYDIA PROBE AMP (~~LOC~~) NOT AT ARMC
Chlamydia: NEGATIVE
Neisseria Gonorrhea: NEGATIVE

## 2018-09-27 LAB — CULTURE, BETA STREP (GROUP B ONLY): Strep Gp B Culture: NEGATIVE

## 2018-09-30 ENCOUNTER — Telehealth (INDEPENDENT_AMBULATORY_CARE_PROVIDER_SITE_OTHER): Payer: Medicaid Other | Admitting: Medical

## 2018-09-30 ENCOUNTER — Encounter: Payer: Self-pay | Admitting: Medical

## 2018-09-30 ENCOUNTER — Other Ambulatory Visit: Payer: Self-pay

## 2018-09-30 DIAGNOSIS — Z3403 Encounter for supervision of normal first pregnancy, third trimester: Secondary | ICD-10-CM

## 2018-09-30 DIAGNOSIS — Z3A37 37 weeks gestation of pregnancy: Secondary | ICD-10-CM

## 2018-09-30 NOTE — Patient Instructions (Signed)
Fetal Movement Counts Patient Name: ________________________________________________ Patient Due Date: ____________________ What is a fetal movement count?  A fetal movement count is the number of times that you feel your baby move during a certain amount of time. This may also be called a fetal kick count. A fetal movement count is recommended for every pregnant woman. You may be asked to start counting fetal movements as early as week 28 of your pregnancy. Pay attention to when your baby is most active. You may notice your baby's sleep and wake cycles. You may also notice things that make your baby move more. You should do a fetal movement count:  When your baby is normally most active.  At the same time each day. A good time to count movements is while you are resting, after having something to eat and drink. How do I count fetal movements? 1. Find a quiet, comfortable area. Sit, or lie down on your side. 2. Write down the date, the start time and stop time, and the number of movements that you felt between those two times. Take this information with you to your health care visits. 3. For 2 hours, count kicks, flutters, swishes, rolls, and jabs. You should feel at least 10 movements during 2 hours. 4. You may stop counting after you have felt 10 movements. 5. If you do not feel 10 movements in 2 hours, have something to eat and drink. Then, keep resting and counting for 1 hour. If you feel at least 4 movements during that hour, you may stop counting. Contact a health care provider if:  You feel fewer than 4 movements in 2 hours.  Your baby is not moving like he or she usually does. Date: ____________ Start time: ____________ Stop time: ____________ Movements: ____________ Date: ____________ Start time: ____________ Stop time: ____________ Movements: ____________ Date: ____________ Start time: ____________ Stop time: ____________ Movements: ____________ Date: ____________ Start time:  ____________ Stop time: ____________ Movements: ____________ Date: ____________ Start time: ____________ Stop time: ____________ Movements: ____________ Date: ____________ Start time: ____________ Stop time: ____________ Movements: ____________ Date: ____________ Start time: ____________ Stop time: ____________ Movements: ____________ Date: ____________ Start time: ____________ Stop time: ____________ Movements: ____________ Date: ____________ Start time: ____________ Stop time: ____________ Movements: ____________ This information is not intended to replace advice given to you by your health care provider. Make sure you discuss any questions you have with your health care provider. Document Released: 04/03/2006 Document Revised: 03/24/2018 Document Reviewed: 04/13/2015 Elsevier Patient Education  2020 Elsevier Inc. Braxton Hicks Contractions Contractions of the uterus can occur throughout pregnancy, but they are not always a sign that you are in labor. You may have practice contractions called Braxton Hicks contractions. These false labor contractions are sometimes confused with true labor. What are Braxton Hicks contractions? Braxton Hicks contractions are tightening movements that occur in the muscles of the uterus before labor. Unlike true labor contractions, these contractions do not result in opening (dilation) and thinning of the cervix. Toward the end of pregnancy (32-34 weeks), Braxton Hicks contractions can happen more often and may become stronger. These contractions are sometimes difficult to tell apart from true labor because they can be very uncomfortable. You should not feel embarrassed if you go to the hospital with false labor. Sometimes, the only way to tell if you are in true labor is for your health care provider to look for changes in the cervix. The health care provider will do a physical exam and may monitor your contractions. If you   are not in true labor, the exam should show  that your cervix is not dilating and your water has not broken. If there are no other health problems associated with your pregnancy, it is completely safe for you to be sent home with false labor. You may continue to have Braxton Hicks contractions until you go into true labor. How to tell the difference between true labor and false labor True labor  Contractions last 30-70 seconds.  Contractions become very regular.  Discomfort is usually felt in the top of the uterus, and it spreads to the lower abdomen and low back.  Contractions do not go away with walking.  Contractions usually become more intense and increase in frequency.  The cervix dilates and gets thinner. False labor  Contractions are usually shorter and not as strong as true labor contractions.  Contractions are usually irregular.  Contractions are often felt in the front of the lower abdomen and in the groin.  Contractions may go away when you walk around or change positions while lying down.  Contractions get weaker and are shorter-lasting as time goes on.  The cervix usually does not dilate or become thin. Follow these instructions at home:   Take over-the-counter and prescription medicines only as told by your health care provider.  Keep up with your usual exercises and follow other instructions from your health care provider.  Eat and drink lightly if you think you are going into labor.  If Braxton Hicks contractions are making you uncomfortable: ? Change your position from lying down or resting to walking, or change from walking to resting. ? Sit and rest in a tub of warm water. ? Drink enough fluid to keep your urine pale yellow. Dehydration may cause these contractions. ? Do slow and deep breathing several times an hour.  Keep all follow-up prenatal visits as told by your health care provider. This is important. Contact a health care provider if:  You have a fever.  You have continuous pain in  your abdomen. Get help right away if:  Your contractions become stronger, more regular, and closer together.  You have fluid leaking or gushing from your vagina.  You pass blood-tinged mucus (bloody show).  You have bleeding from your vagina.  You have low back pain that you never had before.  You feel your baby's head pushing down and causing pelvic pressure.  Your baby is not moving inside you as much as it used to. Summary  Contractions that occur before labor are called Braxton Hicks contractions, false labor, or practice contractions.  Braxton Hicks contractions are usually shorter, weaker, farther apart, and less regular than true labor contractions. True labor contractions usually become progressively stronger and regular, and they become more frequent.  Manage discomfort from Braxton Hicks contractions by changing position, resting in a warm bath, drinking plenty of water, or practicing deep breathing. This information is not intended to replace advice given to you by your health care provider. Make sure you discuss any questions you have with your health care provider. Document Released: 07/18/2016 Document Revised: 02/14/2017 Document Reviewed: 07/18/2016 Elsevier Patient Education  2020 Elsevier Inc.  

## 2018-09-30 NOTE — Progress Notes (Signed)
I connected with Kylie Wallace on 09/30/18 at  3:55 PM EDT by: MyChart and verified that I am speaking with the correct person using two identifiers.  Patient is located at home and provider is located at [facility].     The purpose of this virtual visit is to provide medical care while limiting exposure to the novel coronavirus. I discussed the limitations, risks, security and privacy concerns of performing an evaluation and management service by MyChart and the availability of in person appointments. I also discussed with the patient that there may be a patient responsible charge related to this service. By engaging in this virtual visit, you consent to the provision of healthcare.  Additionally, you authorize for your insurance to be billed for the services provided during this visit.  The patient expressed understanding and agreed to proceed.  The following staff members participated in the virtual visit:  Pam Neal,CMA    PRENATAL VISIT NOTE  Subjective:  Kylie Wallace is a 18 y.o. G1P0 at [redacted]w[redacted]d  for phone visit for ongoing prenatal care.  She is currently monitored for the following issues for this low-risk pregnancy and has Supervision of low-risk first pregnancy on their problem list.  Patient reports backache.  Contractions: Not present. Vag. Bleeding: None.  Movement: Present. Denies leaking of fluid.   The following portions of the patient's history were reviewed and updated as appropriate: allergies, current medications, past family history, past medical history, past social history, past surgical history and problem list.   Objective:   Vitals:   09/30/18 1612  BP: 127/81  Pulse: (!) 105   Self-Obtained  Fetal Status:     Movement: Present     Assessment and Plan:  Pregnancy: G1P0 at [redacted]w[redacted]d 1. Encounter for supervision of low-risk first pregnancy in third trimester - Doing well, occasional low back pain  Term labor symptoms and general obstetric precautions including but  not limited to vaginal bleeding, contractions, leaking of fluid and fetal movement were reviewed in detail with the patient.  Return in about 1 week (around 10/07/2018) for LOB, Virtual.  Future Appointments  Date Time Provider [place]  10/07/2018 10:35 AM Julianne Handler, CNM [facility] [place]  10/14/2018  9:15 AM Luvenia Redden, PA-C [facility] [place]  10/21/2018  2:15 PM Tresea Mall, CNM [facility] [place]  10/21/2018  3:15 PM [facility] N[facility]     Time spent on virtual visit: 8 minutes  Kerry Hough, PA-C

## 2018-10-07 ENCOUNTER — Other Ambulatory Visit: Payer: Self-pay

## 2018-10-07 ENCOUNTER — Telehealth (INDEPENDENT_AMBULATORY_CARE_PROVIDER_SITE_OTHER): Payer: Medicaid Other | Admitting: Certified Nurse Midwife

## 2018-10-07 DIAGNOSIS — Z3403 Encounter for supervision of normal first pregnancy, third trimester: Secondary | ICD-10-CM

## 2018-10-07 DIAGNOSIS — Z3A38 38 weeks gestation of pregnancy: Secondary | ICD-10-CM

## 2018-10-07 DIAGNOSIS — Z3400 Encounter for supervision of normal first pregnancy, unspecified trimester: Secondary | ICD-10-CM

## 2018-10-07 NOTE — Progress Notes (Signed)
   [place] VIRTUAL VIDEO VISIT ENCOUNTER NOTE  Provider location: Center for [employer] at [facility]   I connected with Jordan Likes on 10/07/18 at 10:35 AM EDT by MyChart Video Encounter at home and verified that I am speaking with the correct person using two identifiers.   I discussed the limitations, risks, security and privacy concerns of performing an evaluation and management service virtually and the availability of in person appointments. I also discussed with the patient that there may be a patient responsible charge related to this service. The patient expressed understanding and agreed to proceed. Subjective:  Kylie Wallace is a 18 y.o. G1P0 at [redacted]w[redacted]d being seen today for ongoing prenatal care.  She is currently monitored for the following issues for this low-risk pregnancy and has Supervision of low-risk first pregnancy on their problem list.  Patient reports occasional ctx.  Contractions: Not present. Vag. Bleeding: None.  Movement: Present. Denies any leaking of fluid.   The following portions of the patient's history were reviewed and updated as appropriate: allergies, current medications, past family history, past medical history, past social history, past surgical history and problem list.   Objective:   Vitals:   10/07/18 1111  BP: 118/69  Pulse: 89    Fetal Status:     Movement: Present     General:  Alert, oriented and cooperative. Patient is in no acute distress.  Respiratory: Normal respiratory effort, no problems with respiration noted  Mental Status: Normal mood and affect. Normal behavior. Normal judgment and thought content.  Rest of physical exam deferred due to type of encounter  Imaging: No results found.  Assessment and Plan:  Pregnancy: G1P0 at [redacted]w[redacted]d 1. Encounter for supervision of low-risk first pregnancy, antepartum  Term labor symptoms and general obstetric precautions including but not limited to vaginal  bleeding, contractions, leaking of fluid and fetal movement were reviewed in detail with the patient. I discussed the assessment and treatment plan with the patient. The patient was provided an opportunity to ask questions and all were answered. The patient agreed with the plan and demonstrated an understanding of the instructions. The patient was advised to call back or seek an in-person office evaluation/go to MAU at [facility] for any urgent or concerning symptoms. Please refer to After Visit Summary for other counseling recommendations.   I provided 7 minutes of face-to-face time during this encounter.  Return in about 1 week (around 10/14/2018).- virtual visit  Future Appointments  Date Time Provider [place]  10/14/2018  9:15 AM Danielle Rankin [facility] [place]  10/21/2018  2:15 PM Tresea Mall, CNM [facility] [place]  10/21/2018  3:15 PM [facility] NST [place], [place] for [facility]

## 2018-10-07 NOTE — Patient Instructions (Signed)
Places to have your son circumcised:    [facility] [phone] $480 by 4 wks  [facility] [phone] $244 by 4 wks  [facility] [phone] $175 by 2 wks  [facility] [phone] $250 by 7 days [facility] [phone] $150 by 4 wks  These prices sometimes change but are roughly what you can expect to pay. Please call and confirm pricing.   Circumcision is considered an elective/non-medically necessary procedure. There are many reasons parents decide to have their sons circumsized. During the first year of life circumcised males have a reduced risk of urinary tract infections but after this year the rates between circumcised males and uncircumcised males are the same.  It is safe to have your son circumcised outside of the hospital and the places above perform them regularly.

## 2018-10-10 ENCOUNTER — Inpatient Hospital Stay (HOSPITAL_COMMUNITY)
Admission: AD | Admit: 2018-10-10 | Discharge: 2018-10-12 | DRG: 807 | Disposition: A | Discharge status: Home / Self Care | Payer: Medicaid Other | Attending: Obstetrics and Gynecology | Admitting: Obstetrics and Gynecology

## 2018-10-10 ENCOUNTER — Inpatient Hospital Stay (HOSPITAL_COMMUNITY): Payer: Medicaid Other | Admitting: Anesthesiology

## 2018-10-10 ENCOUNTER — Encounter (HOSPITAL_COMMUNITY): Payer: Self-pay | Admitting: *Deleted

## 2018-10-10 ENCOUNTER — Other Ambulatory Visit: Payer: Self-pay

## 2018-10-10 DIAGNOSIS — Z1159 Encounter for screening for other viral diseases: Secondary | ICD-10-CM | POA: Diagnosis not present

## 2018-10-10 DIAGNOSIS — Z3A38 38 weeks gestation of pregnancy: Secondary | ICD-10-CM | POA: Diagnosis not present

## 2018-10-10 DIAGNOSIS — O43123 Velamentous insertion of umbilical cord, third trimester: Secondary | ICD-10-CM | POA: Diagnosis present

## 2018-10-10 DIAGNOSIS — O4693 Antepartum hemorrhage, unspecified, third trimester: Secondary | ICD-10-CM | POA: Diagnosis present

## 2018-10-10 DIAGNOSIS — O4202 Full-term premature rupture of membranes, onset of labor within 24 hours of rupture: Secondary | ICD-10-CM | POA: Diagnosis not present

## 2018-10-10 LAB — CBC
HCT: 35.3 % — ABNORMAL LOW (ref 36.0–46.0)
Hemoglobin: 11.2 g/dL — ABNORMAL LOW (ref 12.0–15.0)
MCH: 27.1 pg (ref 26.0–34.0)
MCHC: 31.7 g/dL (ref 30.0–36.0)
MCV: 85.3 fL (ref 80.0–100.0)
Platelets: 259 10*3/uL (ref 150–400)
RBC: 4.14 MIL/uL (ref 3.87–5.11)
RDW: 18.5 % — ABNORMAL HIGH (ref 11.5–15.5)
WBC: 10.5 10*3/uL (ref 4.0–10.5)
nRBC: 0 % (ref 0.0–0.2)

## 2018-10-10 LAB — TYPE AND SCREEN
ABO/RH(D): A POS
Antibody Screen: NEGATIVE

## 2018-10-10 LAB — POCT FERN TEST: POCT Fern Test: POSITIVE

## 2018-10-10 LAB — SARS CORONAVIRUS 2 BY RT PCR (HOSPITAL ORDER, PERFORMED IN ~~LOC~~ HOSPITAL LAB): SARS Coronavirus 2: NEGATIVE

## 2018-10-10 LAB — ABO/RH: ABO/RH(D): A POS

## 2018-10-10 LAB — RPR: RPR Ser Ql: NONREACTIVE

## 2018-10-10 MED ORDER — SOD CITRATE-CITRIC ACID 500-334 MG/5ML PO SOLN: 30.0000 mL | ORAL | Status: DC | PRN

## 2018-10-10 MED ORDER — WITCH HAZEL-GLYCERIN EX PADS: 1.0000 "application " | MEDICATED_PAD | CUTANEOUS | Status: DC | PRN

## 2018-10-10 MED ORDER — BENZOCAINE-MENTHOL 20-0.5 % EX AERO
1.0000 "application " | INHALATION_SPRAY | CUTANEOUS | Status: DC | PRN
  Administered 2018-10-11: 1 via TOPICAL
  Filled 2018-10-10: qty 56

## 2018-10-10 MED ORDER — PRENATAL MULTIVITAMIN CH
1.0000 | ORAL_TABLET | Freq: Every day | ORAL | Status: DC
  Administered 2018-10-11 – 2018-10-12 (×2): 1 via ORAL
  Filled 2018-10-10 (×2): qty 1

## 2018-10-10 MED ORDER — ONDANSETRON HCL 4 MG/2ML IJ SOLN: 4.0000 mg | Freq: Four times a day (QID) | INTRAMUSCULAR | Status: DC | PRN

## 2018-10-10 MED ORDER — EPHEDRINE 5 MG/ML INJ: 10.0000 mg | INTRAVENOUS | Status: DC | PRN

## 2018-10-10 MED ORDER — PHENYLEPHRINE 40 MCG/ML (10ML) SYRINGE FOR IV PUSH (FOR BLOOD PRESSURE SUPPORT): 80.0000 ug | PREFILLED_SYRINGE | INTRAVENOUS | Status: DC | PRN

## 2018-10-10 MED ORDER — LIDOCAINE HCL (PF) 1 % IJ SOLN
30.0000 mL | INTRAMUSCULAR | Status: AC | PRN
  Administered 2018-10-10: 30 mL via SUBCUTANEOUS
  Filled 2018-10-10: qty 30

## 2018-10-10 MED ORDER — OXYTOCIN 40 UNITS IN NORMAL SALINE INFUSION - SIMPLE MED
1.0000 m[IU]/min | INTRAVENOUS | Status: DC
  Administered 2018-10-10: 2 m[IU]/min via INTRAVENOUS

## 2018-10-10 MED ORDER — ACETAMINOPHEN 325 MG PO TABS: 650.0000 mg | ORAL_TABLET | ORAL | Status: DC | PRN

## 2018-10-10 MED ORDER — OXYCODONE-ACETAMINOPHEN 5-325 MG PO TABS
2.0000 | ORAL_TABLET | Freq: Once | ORAL | Status: AC
  Administered 2018-10-10: 2 via ORAL
  Filled 2018-10-10 (×2): qty 2

## 2018-10-10 MED ORDER — FENTANYL CITRATE (PF) 100 MCG/2ML IJ SOLN
INTRAMUSCULAR | Status: AC
  Filled 2018-10-10: qty 2

## 2018-10-10 MED ORDER — DIBUCAINE (PERIANAL) 1 % EX OINT: 1.0000 "application " | TOPICAL_OINTMENT | CUTANEOUS | Status: DC | PRN

## 2018-10-10 MED ORDER — TERBUTALINE SULFATE 1 MG/ML IJ SOLN: 0.2500 mg | Freq: Once | INTRAMUSCULAR | Status: DC | PRN

## 2018-10-10 MED ORDER — OXYCODONE-ACETAMINOPHEN 5-325 MG PO TABS: 2.0000 | ORAL_TABLET | ORAL | Status: DC | PRN

## 2018-10-10 MED ORDER — ONDANSETRON HCL 4 MG/2ML IJ SOLN: 4.0000 mg | INTRAMUSCULAR | Status: DC | PRN

## 2018-10-10 MED ORDER — ONDANSETRON HCL 4 MG PO TABS: 4.0000 mg | ORAL_TABLET | ORAL | Status: DC | PRN

## 2018-10-10 MED ORDER — LACTATED RINGERS IV SOLN: 500.0000 mL | Freq: Once | INTRAVENOUS | Status: DC

## 2018-10-10 MED ORDER — OXYCODONE-ACETAMINOPHEN 5-325 MG PO TABS: 1.0000 | ORAL_TABLET | ORAL | Status: DC | PRN

## 2018-10-10 MED ORDER — FENTANYL CITRATE (PF) 100 MCG/2ML IJ SOLN
100.0000 ug | INTRAMUSCULAR | Status: DC | PRN
  Administered 2018-10-10 (×3): 100 ug via INTRAVENOUS
  Filled 2018-10-10 (×2): qty 2

## 2018-10-10 MED ORDER — FENTANYL-BUPIVACAINE-NACL 0.5-0.125-0.9 MG/250ML-% EP SOLN
12.0000 mL/h | EPIDURAL | Status: DC | PRN
  Filled 2018-10-10 (×2): qty 250

## 2018-10-10 MED ORDER — SIMETHICONE 80 MG PO CHEW: 80.0000 mg | CHEWABLE_TABLET | ORAL | Status: DC | PRN

## 2018-10-10 MED ORDER — LACTATED RINGERS IV SOLN: 500.0000 mL | INTRAVENOUS | Status: DC | PRN

## 2018-10-10 MED ORDER — ZOLPIDEM TARTRATE 5 MG PO TABS: 5.0000 mg | ORAL_TABLET | Freq: Every evening | ORAL | Status: DC | PRN

## 2018-10-10 MED ORDER — DIPHENHYDRAMINE HCL 50 MG/ML IJ SOLN: 12.5000 mg | INTRAMUSCULAR | Status: DC | PRN

## 2018-10-10 MED ORDER — OXYTOCIN BOLUS FROM INFUSION
500.0000 mL | Freq: Once | INTRAVENOUS | Status: AC
  Administered 2018-10-10: 18:00:00 500 mL via INTRAVENOUS

## 2018-10-10 MED ORDER — COCONUT OIL OIL: 1.0000 "application " | TOPICAL_OIL | Status: DC | PRN

## 2018-10-10 MED ORDER — DIPHENHYDRAMINE HCL 25 MG PO CAPS: 25.0000 mg | ORAL_CAPSULE | Freq: Four times a day (QID) | ORAL | Status: DC | PRN

## 2018-10-10 MED ORDER — SENNOSIDES-DOCUSATE SODIUM 8.6-50 MG PO TABS
2.0000 | ORAL_TABLET | ORAL | Status: DC
  Administered 2018-10-10 – 2018-10-11 (×2): 2 via ORAL
  Filled 2018-10-10 (×2): qty 2

## 2018-10-10 MED ORDER — LACTATED RINGERS IV SOLN
INTRAVENOUS | Status: DC
  Administered 2018-10-10 (×2): via INTRAVENOUS

## 2018-10-10 MED ORDER — TETANUS-DIPHTH-ACELL PERTUSSIS 5-2.5-18.5 LF-MCG/0.5 IM SUSP: 0.5000 mL | Freq: Once | INTRAMUSCULAR | Status: DC

## 2018-10-10 MED ORDER — OXYTOCIN 40 UNITS IN NORMAL SALINE INFUSION - SIMPLE MED
2.5000 [IU]/h | INTRAVENOUS | Status: DC
  Filled 2018-10-10: qty 1000

## 2018-10-10 MED ORDER — IBUPROFEN 600 MG PO TABS
600.0000 mg | ORAL_TABLET | Freq: Four times a day (QID) | ORAL | Status: DC
  Administered 2018-10-10 – 2018-10-12 (×7): 600 mg via ORAL
  Filled 2018-10-10 (×7): qty 1

## 2018-10-10 NOTE — Discharge Summary (Signed)
Postpartum Discharge Summary     Patient Name: Kylie Wallace DOB: 11-10-2000 MRN: [id number]  Date of admission: 10/10/2018 Delivering Provider: Wende Mott   Date of discharge: 10/12/2018  Admitting diagnosis: 38 wks ctx and bleeding Intrauterine pregnancy: [redacted]w[redacted]d     Secondary diagnosis:  Active Problems:   Normal labor  Additional problems: n/a     Discharge diagnosis: Term Pregnancy Delivered                                                                                                Post partum procedures:n/a  Augmentation: Pitocin  Complications: None  Hospital course:  Onset of Labor With Vaginal Delivery     18 y.o. yo G1P0000 at [redacted]w[redacted]d was admitted in Latent Labor on 10/10/2018. Patient had an uncomplicated labor course as follows:  Membrane Rupture Time/Date: 6:00 AM ,10/10/2018   Intrapartum Procedures: Episiotomy: None [1]                                         Lacerations:  1st degree [2];Perineal [11]  Patient had a delivery of a Viable infant. 10/10/2018  Information for the patient's newborn:  Hermenia, Fritcher [[id number]]  Delivery Method: Vag-Spont     Pateint had an uncomplicated postpartum course.  She is ambulating, tolerating a regular diet, passing flatus, and urinating well. Patient is discharged home in stable condition on 10/12/18.   Magnesium Sulfate recieved: No BMZ received: No  Physical exam  Vitals:   10/11/18 0939 10/11/18 1430 10/11/18 2211 10/12/18 0630  BP: 111/73 105/64 136/73 110/78  Pulse: 84 72 85 79  Resp: 18 17 18 16   Temp: (!) 97.3 F (36.3 C) 99 F (37.2 C) 98.3 F (36.8 C) 98.2 F (36.8 C)  TempSrc: Oral Oral  Oral  SpO2:  100% 99% 99%  Weight:      Height:       General: alert, cooperative and no distress Lochia: appropriate Uterine Fundus: firm Incision: Healing well with no significant drainage, No significant erythema DVT Evaluation: No evidence of DVT seen on physical exam. Negative Homan's  sign. No cords or calf tenderness. No significant calf/ankle edema. Labs: Lab Results  Component Value Date   WBC 12.6 (H) 10/11/2018   HGB 9.4 (L) 10/11/2018   HCT 30.6 (L) 10/11/2018   MCV 86.2 10/11/2018   PLT 228 10/11/2018   CMP Latest Ref Rng & Units 05/02/2017  Glucose 65 - 99 mg/dL 109(H)  BUN 6 - 20 mg/dL <5(L)  Creatinine 0.44 - 1.00 mg/dL 0.82  Sodium 135 - 145 mmol/L 138  Potassium 3.5 - 5.1 mmol/L 4.1  Chloride 101 - 111 mmol/L 104  CO2 22 - 32 mmol/L 24  Calcium 8.9 - 10.3 mg/dL 9.0  Total Protein 6.5 - 8.1 g/dL 7.4  Total Bilirubin 0.3 - 1.2 mg/dL 0.4  Alkaline Phos 38 - 126 U/L 79  AST 15 - 41 U/L 22  ALT 14 - 54 U/L 18    Discharge instruction:  per After Visit Summary and "Baby and Me Booklet".  After visit meds:  Allergies as of 10/12/2018   No Known Allergies     Medication List    TAKE these medications   ibuprofen 600 MG tablet Commonly known as: ADVIL Take 1 tablet (600 mg total) by mouth every 6 (six) hours.   multivitamin-prenatal 27-0.8 MG Tabs tablet Take 1 tablet by mouth daily at 12 noon.       Diet: routine diet  Activity: Advance as tolerated. Pelvic rest for 6 weeks.   Outpatient follow up:6 weeks Follow up Appt: Future Appointments  Date Time Provider Department Center  10/14/2018  9:15 AM Marny LowensteinWenzel, Julie N, PA-C [facility]  10/21/2018  2:15 PM Armando ReichertHogan, Heather D, CNM [facility]  10/21/2018  3:15 PM [facility] NST [facility]   Follow up Visit:  Please schedule this patient for PP visit in: 4 weeks Low risk pregnancy complicated by: n/a Delivery mode:  SVD Anticipated Birth Control:  POPs PP Procedures needed: n/a  Schedule Integrated BH visit: no Provider: Any provider  Newborn Data: Live born female  Birth Weight:   APGAR: 8, 9  Newborn Delivery   Birth date/time: 10/10/2018 17:58:00 Delivery type: Vaginal, Spontaneous      Baby Feeding: Bottle and Breast Disposition:home with mother   10/12/2018 Donette LarryMelanie  Estle Sabella, CNM

## 2018-10-10 NOTE — Progress Notes (Signed)
Just before spec exam pt felt like she was leaking something. Fern slide made during spec exam. No blood seen

## 2018-10-10 NOTE — H&P (Signed)
Kylie Wallace is a 18 y.o. female G1P0 @[redacted]w[redacted]d  pt of [facility] presenting for onset of vaginal bleeding and abdominal cramping today. She reports bleeding was light red, when wiping and has not required a pad.  The cramping is irregular and mild. There are no other symptoms. She has not tried any treatments.  While in MAU, she reports leaking of clear fluid.    OB History    Gravida  1   Para      Term      Preterm      AB      Living        SAB      TAB      Ectopic      Multiple      Live Births             Past Medical History:  Diagnosis Date  . Medical history non-contributory    Past Surgical History:  Procedure Laterality Date  . WISDOM TOOTH EXTRACTION     Family History: family history includes Hypertension in her mother. Social History:  reports that she has never smoked. She has never used smokeless tobacco. She reports that she does not drink alcohol or use drugs.     Maternal Diabetes: No Genetic Screening: Normal Maternal Ultrasounds/Referrals: Normal Fetal Ultrasounds or other Referrals:  None Maternal Substance Abuse:  No Significant Maternal Medications:  None Significant Maternal Lab Results:  Group B Strep negative Other Comments:  None  Review of Systems  Constitutional: Negative for chills and fever.  Respiratory: Negative for shortness of breath.   Cardiovascular: Negative for chest pain.  Gastrointestinal: Positive for abdominal pain. Negative for constipation, diarrhea and vomiting.  Neurological: Negative for dizziness and headaches.  All other systems reviewed and are negative.  Maternal Medical History:  Reason for admission: Rupture of membranes and contractions.   Contractions: Onset was 3-5 hours ago.   Frequency: irregular.   Perceived severity is mild.    Fetal activity: Perceived fetal activity is normal.   Last perceived fetal movement was within the past hour.    Prenatal complications: no prenatal  complications Prenatal Complications - Diabetes: none.      Blood pressure 119/74, pulse 97, temperature 98.7 F (37.1 C), resp. rate 16, height 5\' 4"  (1.626 m), weight 90.3 kg, last menstrual period 01/16/2018. Maternal Exam:  Uterine Assessment: Contraction strength is mild.  Contraction frequency is irregular.   Abdomen: Fetal presentation: vertex  Introitus: Ferning test: positive.  Amniotic fluid character: clear.  Cervix: Cervix evaluated by digital exam.     Fetal Exam Fetal Monitor Review: Mode: ultrasound.   Baseline rate: 125.  Variability: moderate (6-25 bpm).   Pattern: accelerations present and no decelerations.    Fetal State Assessment: Category I - tracings are normal.     Physical Exam  Nursing note and vitals reviewed. Constitutional: She is oriented to person, place, and time. She appears well-developed and well-nourished.  Neck: Normal range of motion.  Cardiovascular: Normal rate and regular rhythm.  Respiratory: Effort normal and breath sounds normal.  GI: Soft.  Musculoskeletal: Normal range of motion.  Neurological: She is alert and oriented to person, place, and time.  Skin: Skin is warm and dry.  Psychiatric: She has a normal mood and affect. Her behavior is normal. Judgment and thought content normal.    Prenatal labs: ABO, Rh: A/Positive/-- (02/11 1205) Antibody: Negative (02/11 1205) Rubella: 1.44 (02/11 1205) RPR: Non Reactive (05/14 0935)  HBsAg:  Negative (02/11 1205)  HIV: Non Reactive (05/14 0935)  GBS:   Negative  Assessment/Plan: 18 y.o. G1P0 @[redacted]w[redacted]d  with ROM and early labor at term GBS negative Admit to Labor and Delivery Expectant management on admission, consider augmentation as needed  Sharen CounterLisa Leftwich-Kirby 10/10/2018, 6:06 AM

## 2018-10-10 NOTE — MAU Note (Signed)
Covid swab obtained without difficulty and pt tol well. No symptoms 

## 2018-10-10 NOTE — Anesthesia Preprocedure Evaluation (Deleted)

## 2018-10-10 NOTE — Progress Notes (Signed)
Labor Progress Note Kylie Wallace is a 18 y.o. G1P0 at [redacted]w[redacted]d presented for SROM  S:  Patient reporting worsening contractions  O:  BP 125/66   Pulse 89   Temp 97.7 F (36.5 C) (Oral)   Resp 18   Ht 5\' 4"  (1.626 m)   Wt 90.3 kg   LMP 01/16/2018 (Approximate)   BMI 34.17 kg/m   Fetal Tracing:  Baseline: 120 Variability: moderate Accels: 15x15 Decels: none  Toco: 3-5 palpated   CVE: Dilation: 5 Effacement (%): 80 Cervical Position: Anterior Station: 0 Presentation: Vertex Exam by:: Len Blalock, CNM   A&P: 18 y.o. G1P0 [redacted]w[redacted]d SROM #Labor: Progressing well. Will continue pitocin #Pain: per patient request #FWB: Cat 1 #GBS negative  Wende Mott, CNM 1:24 PM

## 2018-10-10 NOTE — Progress Notes (Signed)
FHR 122 on transfer to BS from MAU

## 2018-10-10 NOTE — Progress Notes (Signed)
Labor Progress Note Emelly Wurtz is a 18 y.o. G1P0 at [redacted]w[redacted]d presented for SROM  S:  Patient comfortable. Feels contractions but not painful  O:  BP 121/73   Pulse 74   Temp 97.8 F (36.6 C) (Oral)   Resp 18   Ht 5\' 4"  (1.626 m)   Wt 90.3 kg   LMP 01/16/2018 (Approximate)   BMI 34.17 kg/m   Fetal Tracing:  Baseline: 120 Variability: moderate Accels: 15x15 Decels: none  Toco: occasional uc's   CVE: Dilation: 3.5 Effacement (%): 70 Cervical Position: Anterior Station: -3 Presentation: Vertex Exam by:: Len Blalock, CNM   A&P: 18 y.o. G1P0 [redacted]w[redacted]d SROM #Labor: Expectant management for last 3 hours with no change. Discussed with patient option to continue expectant management or start pitocin. Risks and benefits of both reviewed. Patient desires pitocin for augmentation. Will start pitocin 2x2. #Pain: per patient request #FWB: Cat 1 #GBS negative  Wende Mott, CNM 9:45 AM

## 2018-10-10 NOTE — Progress Notes (Signed)
LL Kirby CNM notified of pt's admission and status. Will see pt

## 2018-10-10 NOTE — Plan of Care (Signed)

## 2018-10-10 NOTE — MAU Note (Signed)
Pt got ujp to BR this am and saw some dark red blood in toilet and when she wiped. Did have intercourse earlier today. Mild abd cramping and some low back pain.

## 2018-10-11 ENCOUNTER — Encounter (HOSPITAL_COMMUNITY): Payer: Self-pay | Admitting: *Deleted

## 2018-10-11 LAB — CBC
HCT: 30.6 % — ABNORMAL LOW (ref 36.0–46.0)
Hemoglobin: 9.4 g/dL — ABNORMAL LOW (ref 12.0–15.0)
MCH: 26.5 pg (ref 26.0–34.0)
MCHC: 30.7 g/dL (ref 30.0–36.0)
MCV: 86.2 fL (ref 80.0–100.0)
Platelets: 228 10*3/uL (ref 150–400)
RBC: 3.55 MIL/uL — ABNORMAL LOW (ref 3.87–5.11)
RDW: 18.4 % — ABNORMAL HIGH (ref 11.5–15.5)
WBC: 12.6 10*3/uL — ABNORMAL HIGH (ref 4.0–10.5)
nRBC: 0 % (ref 0.0–0.2)

## 2018-10-11 NOTE — Progress Notes (Addendum)
POSTPARTUM PROGRESS NOTE  Post Partum Day 1  Subjective:  Kylie Wallace is a 18 y.o. G1P1001 s/p SVD at [redacted]w[redacted]d.  She reports she is doing well. No acute events overnight. She denies any problems with ambulating, voiding or PO intake. Pain is well controlled.  Lochia is appropriate.  Reports flatulence, no BM yet.  Objective: Blood pressure 108/60, pulse 79, temperature 98.2 F (36.8 C), temperature source Oral, resp. rate 18, height 5\' 4"  (1.626 m), weight 90.3 kg, last menstrual period 01/16/2018, SpO2 99 %, unknown if currently breastfeeding.  Physical Exam:  General: Alert, cooperative and no distress Chest: No respiratory distress Heart: Regular rate, distal pulses intact Abdomen: Soft, appropriately tender, uterine fundus firm to palpation DVT Evaluation: No calf swelling or tenderness, no LE edema Skin: Warm, dry  Recent Labs    10/10/18 0624 10/11/18 0433  HGB 11.2* 9.4*  HCT 35.3* 30.6*    Assessment/Plan: Kylie Wallace is a 18 y.o. G1P1001 s/p SVD at [redacted]w[redacted]d.   PPD#1 - Doing well; continue routine postpartum care Contraception: POPs Feeding: Breast and bottle feeding; going well Dispo: Plan for discharge tomorrow   LOS: 1 day   Vilma Meckel, MD  Family Medicine, PGY-2 10/11/2018, 8:04 AM

## 2018-10-11 NOTE — Lactation Note (Signed)
This note was copied from a baby's chart. Lactation Consultation Note Baby 66 hrs old. Baby hasn't been to the breast. Has had formula. Mom is breast/formula feeding.  Mom had given baby 15 ml formula 30 min. Prior to [facility] visit, Placed baby at the breast in football position. Baby latched well. Mom has bulbous areolas, everted nipples.  Baby suckled at intervals. Mom denied painful latch. Newborn feeding habits, STS, I&O, breast massage, positioning, support, safety, supply and demand discussed. Encouraged mom to BF first then supplement if needed. Reviewed supplementing amounts. Hand expression taught w/easily expressed colostrum. Mom excited when she saw colostrum. Baby has recessed chin, encouraged to do chin tug after latching. Encouraged mom to call for assistance or questions about BF. Mom denied need for Interpreter. Requested Lactation brochure in [place].  Patient Name: Kylie Wallace [id number] Date: 10/11/2018 Reason for consult: Initial assessment;1st time breastfeeding;Early term 37-38.6wks   Maternal Data Has patient been taught Hand Expression?: Yes Does the patient have breastfeeding experience prior to this delivery?: No  Feeding Feeding Type: Breast Fed Nipple Type: Slow - flow  LATCH Score Latch: Repeated attempts needed to sustain latch, nipple held in mouth throughout feeding, stimulation needed to elicit sucking reflex.  Audible Swallowing: A few with stimulation  Type of Nipple: Everted at rest and after stimulation  Comfort (Breast/Nipple): Soft / non-tender  Hold (Positioning): Assistance needed to correctly position infant at breast and maintain latch.  LATCH Score: 7  Interventions Interventions: Breast feeding basics reviewed;Adjust position;Assisted with latch;Support pillows;Skin to skin;Position options;Breast massage;Hand express;Breast compression  Lactation Tools Discussed/Used WIC Program: Yes   Consult Status Consult Status:  Follow-up Date: 10/12/18 Follow-up type: In-patient    Mirta Mally, [place]ta [place] 10/11/2018, 3:07 AM

## 2018-10-12 MED ORDER — IBUPROFEN 600 MG PO TABS: 600.0000 mg | ORAL_TABLET | Freq: Four times a day (QID) | ORAL | 0 refills | Status: DC

## 2018-10-12 NOTE — Lactation Note (Addendum)
This note was copied from a baby's chart. Lactation Consultation Note Baby 42 hrs old.  Mom is breast/formula feeding. Mainly formula feeding. Mom putting to the breast some but for short periods. Discussed importance of feeding to the breast 15-20 min. At least then supplement if needed. [place] isn't sure how committed mom is about BF.  Mom had all ready given baby formula when LC entered room. Baby sucking on hand. Mom tried to latch baby but he wouldn't do anything but hold the nipple in his mouth. Encouraged hand expression for stimulation and pumping. Mom stated ok. Discussed if mom was just giving formula that according to hours of age 72 ml wasn't enough food. Mom needs to increase amount of feeding.  Patient Name: Kylie Wallace [id number] Date: 10/12/2018 Reason for consult: Follow-up assessment;1st time breastfeeding;Primapara   Maternal Data    Feeding Feeding Type: Formula Nipple Type: Slow - flow  LATCH Score Latch: Too sleepy or reluctant, no latch achieved, no sucking elicited.  Audible Swallowing: None  Type of Nipple: Everted at rest and after stimulation  Comfort (Breast/Nipple): Soft / non-tender  Hold (Positioning): Assistance needed to correctly position infant at breast and maintain latch.  LATCH Score: 5  Interventions Interventions: Breast feeding basics reviewed;Adjust position;Assisted with latch;Support pillows;Breast massage  Lactation Tools Discussed/Used     Consult Status Consult Status: Follow-up Date: 10/12/18 Follow-up type: In-patient    Theodoro Kalata 10/12/2018, 2:34 AM

## 2018-10-14 ENCOUNTER — Telehealth: Payer: Medicaid Other | Admitting: Student

## 2018-10-14 ENCOUNTER — Other Ambulatory Visit: Payer: Self-pay

## 2018-10-21 ENCOUNTER — Encounter: Payer: Medicaid Other | Admitting: Advanced Practice Midwife

## 2018-10-21 ENCOUNTER — Other Ambulatory Visit: Payer: Medicaid Other

## 2018-11-09 ENCOUNTER — Telehealth: Payer: Self-pay | Admitting: Advanced Practice Midwife

## 2018-11-09 NOTE — Telephone Encounter (Signed)
Spoke with patient about her appointment on 8/25 @ 10:35. Patient instructed that the appointment is a mychart visit. Patient instructed to download the mychart app if not already done so. Patient verbalized she has the app downloaded

## 2018-11-10 ENCOUNTER — Telehealth (INDEPENDENT_AMBULATORY_CARE_PROVIDER_SITE_OTHER): Payer: Medicaid Other | Admitting: Advanced Practice Midwife

## 2018-11-10 ENCOUNTER — Other Ambulatory Visit: Payer: Self-pay

## 2018-11-10 DIAGNOSIS — Z30011 Encounter for initial prescription of contraceptive pills: Secondary | ICD-10-CM

## 2018-11-10 DIAGNOSIS — O9279 Other disorders of lactation: Secondary | ICD-10-CM

## 2018-11-10 MED ORDER — NORETHINDRONE 0.35 MG PO TABS: 1.0000 | ORAL_TABLET | Freq: Every day | ORAL | 12 refills | Status: DC

## 2018-11-10 NOTE — Patient Instructions (Signed)
Contraception Choices Contraception, also called birth control, refers to methods or devices that prevent pregnancy. Hormonal methods Contraceptive implant  A contraceptive implant is a thin, plastic tube that contains a hormone. It is inserted into the upper part of the arm. It can remain in place for up to 3 years. Progestin-only injections Progestin-only injections are injections of progestin, a synthetic form of the hormone progesterone. They are given every 3 months by a health care provider. Birth control pills  Birth control pills are pills that contain hormones that prevent pregnancy. They must be taken once a day, preferably at the same time each day. Birth control patch  The birth control patch contains hormones that prevent pregnancy. It is placed on the skin and must be changed once a week for three weeks and removed on the fourth week. A prescription is needed to use this method of contraception. Vaginal ring  A vaginal ring contains hormones that prevent pregnancy. It is placed in the vagina for three weeks and removed on the fourth week. After that, the process is repeated with a new ring. A prescription is needed to use this method of contraception. Emergency contraceptive Emergency contraceptives prevent pregnancy after unprotected sex. They come in pill form and can be taken up to 5 days after sex. They work best the sooner they are taken after having sex. Most emergency contraceptives are available without a prescription. This method should not be used as your only form of birth control. Barrier methods Female condom  A female condom is a thin sheath that is worn over the penis during sex. Condoms keep sperm from going inside a woman's body. They can be used with a spermicide to increase their effectiveness. They should be disposed after a single use. Female condom  A female condom is a soft, loose-fitting sheath that is put into the vagina before sex. The condom keeps sperm  from going inside a woman's body. They should be disposed after a single use. Diaphragm  A diaphragm is a soft, dome-shaped barrier. It is inserted into the vagina before sex, along with a spermicide. The diaphragm blocks sperm from entering the uterus, and the spermicide kills sperm. A diaphragm should be left in the vagina for 6-8 hours after sex and removed within 24 hours. A diaphragm is prescribed and fitted by a health care provider. A diaphragm should be replaced every 1-2 years, after giving birth, after gaining more than 15 lb (6.8 kg), and after pelvic surgery. Cervical cap  A cervical cap is a round, soft latex or plastic cup that fits over the cervix. It is inserted into the vagina before sex, along with spermicide. It blocks sperm from entering the uterus. The cap should be left in place for 6-8 hours after sex and removed within 48 hours. A cervical cap must be prescribed and fitted by a health care provider. It should be replaced every 2 years. Sponge  A sponge is a soft, circular piece of polyurethane foam with spermicide on it. The sponge helps block sperm from entering the uterus, and the spermicide kills sperm. To use it, you make it wet and then insert it into the vagina. It should be inserted before sex, left in for at least 6 hours after sex, and removed and thrown away within 30 hours. Spermicides Spermicides are chemicals that kill or block sperm from entering the cervix and uterus. They can come as a cream, jelly, suppository, foam, or tablet. A spermicide should be inserted into the   vagina with an applicator at least 10-15 minutes before sex to allow time for it to work. The process must be repeated every time you have sex. Spermicides do not require a prescription. Intrauterine contraception Intrauterine device (IUD) An IUD is a T-shaped device that is put in a woman's uterus. There are two types:  Hormone IUD.This type contains progestin, a synthetic form of the hormone  progesterone. This type can stay in place for 3-5 years.  Copper IUD.This type is wrapped in copper wire. It can stay in place for 10 years.  Permanent methods of contraception Female tubal ligation In this method, a woman's fallopian tubes are sealed, tied, or blocked during surgery to prevent eggs from traveling to the uterus. Hysteroscopic sterilization In this method, a small, flexible insert is placed into each fallopian tube. The inserts cause scar tissue to form in the fallopian tubes and block them, so sperm cannot reach an egg. The procedure takes about 3 months to be effective. Another form of birth control must be used during those 3 months. Female sterilization This is a procedure to tie off the tubes that carry sperm (vasectomy). After the procedure, the man can still ejaculate fluid (semen). Natural planning methods Natural family planning In this method, a couple does not have sex on days when the woman could become pregnant. Calendar method This means keeping track of the length of each menstrual cycle, identifying the days when pregnancy can happen, and not having sex on those days. Ovulation method In this method, a couple avoids sex during ovulation. Symptothermal method This method involves not having sex during ovulation. The woman typically checks for ovulation by watching changes in her temperature and in the consistency of cervical mucus. Post-ovulation method In this method, a couple waits to have sex until after ovulation. Summary  Contraception, also called birth control, means methods or devices that prevent pregnancy.  Hormonal methods of contraception include implants, injections, pills, patches, vaginal rings, and emergency contraceptives.  Barrier methods of contraception can include female condoms, female condoms, diaphragms, cervical caps, sponges, and spermicides.  There are two types of IUDs (intrauterine devices). An IUD can be put in a woman's uterus to  prevent pregnancy for 3-5 years.  Permanent sterilization can be done through a procedure for males, females, or both.  Natural family planning methods involve not having sex on days when the woman could become pregnant. This information is not intended to replace advice given to you by your health care provider. Make sure you discuss any questions you have with your health care provider. Document Released: 03/04/2005 Document Revised: 03/06/2017 Document Reviewed: 04/06/2016 Elsevier Patient Education  2020 Elsevier Inc.  

## 2018-11-10 NOTE — Progress Notes (Signed)
TELEHEALTH POSTPARTUM VIRTUAL VIDEO VISIT ENCOUNTER NOTE   Provider location: Center for [employer] at [facility]   I connected with Kylie Wallace on 11/10/18 at 10:35 AM EDT by MyChart Video Encounter at home and verified that I am speaking with the correct person using two identifiers.    I discussed the limitations, risks, security and privacy concerns of performing an evaluation and management service virtually and the availability of in person appointments. I also discussed with the patient that there may be a patient responsible charge related to this service. The patient expressed understanding and agreed to proceed.  Chief Complaint: Postpartum Visit  History of Present Illness: Kylie Wallace is a 18 y.o. Hispanic G1P1001 being evaluated for postpartum followup.    She is s/p normal spontaneous vaginal delivery on 10/10/2018 at [redacted]w[redacted]d weeks; she was discharged to home on 10/12/2018. Pregnancy complicated by none. Baby is doing well  Complains of latching difficulties, improving.   Vaginal bleeding or discharge: Yes  Intercourse: No  Contraception: oral contraceptives (estrogen/progesterone) Mode of feeding infant: Breast PP depression s/s: No .  Any bowel or bladder issues: No  Pap smear: NA <21  Review of Systems: Positive for scant spotting. Her 12 point review of systems is negative or as noted in the History of Present Illness.  Patient Active Problem List   Diagnosis Date Noted  . Normal labor 10/10/2018  . Supervision of low-risk first pregnancy 04/28/2018    Medications Letha Potenza had no medications administered during this visit. Current Outpatient Medications  Medication Sig Dispense Refill  . ibuprofen (ADVIL) 600 MG tablet Take 1 tablet (600 mg total) by mouth every 6 (six) hours. 30 tablet 0  . Prenatal Vit-Fe Fumarate-FA (MULTIVITAMIN-PRENATAL) 27-0.8 MG TABS tablet Take 1 tablet by mouth daily at 12 noon.     No current  facility-administered medications for this visit.     Allergies Patient has no known allergies.  Physical Exam:  Not able to check BP due to needing new batteries. Will replace and enter result.  General:  Alert, oriented and cooperative. Patient is in no acute distress.  Mental Status: Normal mood and affect. Normal behavior. Normal judgment and thought content.   Respiratory: Normal respiratory effort noted, no problems with respiration noted  Rest of physical exam deferred due to type of encounter  PP Depression Screening:   Edinburgh Postnatal Depression Scale Screening Tool 10/11/2018 10/10/2018  I have been able to laugh and see the funny side of things. 0 (No Data)  I have looked forward with enjoyment to things. 0 -  I have blamed myself unnecessarily when things went wrong. 0 -  I have been anxious or worried for no good reason. 0 -  I have felt scared or panicky for no good reason. 0 -  Things have been getting on top of me. 0 -  I have been so unhappy that I have had difficulty sleeping. 0 -  I have felt sad or miserable. 0 -  I have been so unhappy that I have been crying. 0 -  The thought of harming myself has occurred to me. 0 -  Edinburgh Postnatal Depression Scale Total 0 -     Assessment:Patient is a 18 y.o. G1P1001 who is 4 weeks postpartum from a normal spontaneous vaginal delivery.  She is doing well.   Plan: POPs 1. Oral contraception initial prescription  - norethindrone (MICRONOR) 0.35 MG tablet; Take 1 tablet (0.35 mg total) by mouth daily.  Dispense: 1 Package; Refill: 12  2. Postpartum care and examination - Pap 04/2019  3. Poor latch on, postpartum  - Ambulatory referral to Lactation   RTC 04/2019  I discussed the assessment and treatment plan with the patient. The patient was provided an opportunity to ask questions and all were answered. The patient agreed with the plan and demonstrated an understanding of the instructions.   The patient was  advised to call back or seek an in-person evaluation/go to the ED for any concerning postpartum symptoms.  I provided 15 minutes of face-to-face time during this encounter.   Katrinka BlazingSmith, [place], CNM 11/10/2018 11:36 AM  [facility]

## 2018-11-16 ENCOUNTER — Telehealth: Payer: Self-pay | Admitting: *Deleted

## 2018-11-16 NOTE — Telephone Encounter (Signed)
I called Kylie Wallace to discuss her complaints of bleeding ,etc that she sent thru [place]. She c/o passing 2 clots a little bigger than a quarter. States her bleeding stopped about 2 weeks after delivery; stopped for about 1- 2 weeks , then restarted last week. States she wears pad and changes about every 30 minutes because she urinated often because she drinks a lot of water and the pad is less than 1/2 full. We discussed bleeding is normal 4-6 weeks after delivery . We also discussed she just started taking birth control pills Saturday and that can affect her bleeding- causing irregular bleeding. I explained as her body adjusts to the medication her bleeding should get less. I advised her to use back up birth control until she is on the second pack of pills and to try to take about the same time everyday to prevent unplanned pregnancy. I advised her to message or call us if she has heavier bleeding or other issues. She voices understanding.  Kylie Kwasnik,RN

## 2019-01-04 ENCOUNTER — Encounter (HOSPITAL_COMMUNITY): Payer: Self-pay

## 2019-01-04 ENCOUNTER — Other Ambulatory Visit: Payer: Self-pay

## 2019-01-04 ENCOUNTER — Ambulatory Visit (HOSPITAL_COMMUNITY)
Admission: EM | Admit: 2019-01-04 | Discharge: 2019-01-04 | Disposition: A | Discharge status: Home / Self Care | Payer: Medicaid Other | Attending: Family Medicine | Admitting: Family Medicine

## 2019-01-04 DIAGNOSIS — T25222A Burn of second degree of left foot, initial encounter: Secondary | ICD-10-CM

## 2019-01-04 MED ORDER — SILVER SULFADIAZINE 1 % EX CREA: 1.0000 "application " | TOPICAL_CREAM | Freq: Every day | CUTANEOUS | 0 refills | Status: DC
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
## 2019-01-04 NOTE — Discharge Instructions (Signed)
Wash area daily and apply Silvadene cream.  Wrap with gauze Repeat and reduce the dressing if it becomes soiled The Silvadene cream should prevent infection and help reduce pain Stop daily dressing changes when burn has healed Return for any infection, increasing redness, pain, or yellow drainage
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
## 2019-03-25 ENCOUNTER — Other Ambulatory Visit: Payer: Medicaid Other

## 2019-03-26 ENCOUNTER — Ambulatory Visit: Payer: Medicaid Other | Attending: Internal Medicine

## 2019-03-26 DIAGNOSIS — Z20822 Contact with and (suspected) exposure to covid-19: Secondary | ICD-10-CM

## 2019-03-28 LAB — NOVEL CORONAVIRUS, NAA: SARS-CoV-2, NAA: NOT DETECTED

## 2019-04-06 ENCOUNTER — Ambulatory Visit (HOSPITAL_COMMUNITY)
Admission: EM | Admit: 2019-04-06 | Discharge: 2019-04-06 | Disposition: A | Discharge status: Home / Self Care | Payer: Medicaid Other | Attending: Family Medicine | Admitting: Family Medicine

## 2019-04-06 ENCOUNTER — Encounter (HOSPITAL_COMMUNITY): Payer: Self-pay

## 2019-04-06 DIAGNOSIS — L02411 Cutaneous abscess of right axilla: Secondary | ICD-10-CM | POA: Diagnosis not present

## 2019-04-06 DIAGNOSIS — L0291 Cutaneous abscess, unspecified: Secondary | ICD-10-CM

## 2019-04-06 MED ORDER — CEPHALEXIN 500 MG PO CAPS: 500.0000 mg | ORAL_CAPSULE | Freq: Four times a day (QID) | ORAL | 0 refills | Status: DC

## 2019-04-06 NOTE — Discharge Instructions (Signed)
We drained the abscess You can keep doing warm compresses and express area to see if it with drain more Placing you on antibiotics.  You can take ibuprofen for the pain  Follow up as needed for continued or worsening symptoms
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
## 2019-04-06 NOTE — ED Provider Notes (Signed)
[facility]    CSN: [id number] Arrival date & time: 04/06/19  7672      History   Chief Complaint Chief Complaint  Patient presents with  . Recurrent Skin Infections    right armpit     HPI Kylie Wallace is a 19 y.o. female.   19 year old female presents today with abscess to right axilla.  Symptoms been constant and worsening for the past week.  Denies any drainage from the area.  Area is painful, red.  Denies any history of the same or history of MRSA.  Denies any fever, chills, body aches, nausea.has not done anything to treat.   ROS per HPI      Past Medical History:  Diagnosis Date  . Medical history non-contributory     There are no problems to display for this patient.   Past Surgical History:  Procedure Laterality Date  . WISDOM TOOTH EXTRACTION      OB History    Gravida  1   Para  1   Term  1   Preterm  0   AB  0   Living  1     SAB  0   TAB  0   Ectopic  0   Multiple  0   Live Births  1            Home Medications    Prior to Admission medications   Medication Sig Start Date End Date Taking? Authorizing Provider  cephALEXin (KEFLEX) 500 MG capsule Take 1 capsule (500 mg total) by mouth 4 (four) times daily. 04/06/19   Dahlia Byes A, NP  Prenatal Vit-Fe Fumarate-FA (MULTIVITAMIN-PRENATAL) 27-0.8 MG TABS tablet Take 1 tablet by mouth daily at 12 noon.    [provider]  silver sulfADIAZINE (SILVADENE) 1 % cream Apply 1 application topically daily. 01/04/19   Eustace Moore, MD  norethindrone (MICRONOR) 0.35 MG tablet Take 1 tablet (0.35 mg total) by mouth daily. 11/10/18 01/04/19  Dorathy Kinsman, CNM    Family History Family History  Problem Relation Age of Onset  . Hypertension Mother   . Healthy Father     Social History Social History   Tobacco Use  . Smoking status: Never Smoker  . Smokeless tobacco: Never Used  Substance Use Topics  . Alcohol use: No  . Drug use: No      Allergies   Patient has no known allergies.   Review of Systems Review of Systems   Physical Exam Triage Vital Signs ED Triage Vitals  Enc Vitals Group     BP 04/06/19 0831 109/77     Pulse Rate 04/06/19 0831 (!) 101     Resp 04/06/19 0831 16     Temp 04/06/19 0831 98.4 F (36.9 C)     Temp Source 04/06/19 0831 Oral     SpO2 04/06/19 0831 98 %     Weight --      Height --      Head Circumference --      Peak Flow --      Pain Score 04/06/19 0832 10     Pain Loc --      Pain Edu? --      Excl. in GC? --    No data found.  Updated Vital Signs BP 109/77 (BP Location: Left Arm)   Pulse (!) 101   Temp 98.4 F (36.9 C) (Oral)   Resp 16   LMP 03/01/2019   SpO2 98%  Visual Acuity Right Eye Distance:   Left Eye Distance:   Bilateral Distance:    Right Eye Near:   Left Eye Near:    Bilateral Near:     Physical Exam Vitals and nursing note reviewed.  Constitutional:      General: She is not in acute distress.    Appearance: Normal appearance. She is not ill-appearing, toxic-appearing or diaphoretic.  HENT:     Head: Normocephalic.     Nose: Nose normal.     Mouth/Throat:     Pharynx: Oropharynx is clear.  Eyes:     Conjunctiva/sclera: Conjunctivae normal.  Pulmonary:     Effort: Pulmonary effort is normal.  Abdominal:     Tenderness: There is no abdominal tenderness.  Musculoskeletal:        General: Normal range of motion.     Cervical back: Normal range of motion.  Skin:    General: Skin is warm and dry.     Findings: No rash.     Comments: Approximated 4 to 5 cm abscess to right axilla.  2 cm central fluctuance with surrounding erythema and induration.  Tender to touch. No drainage  Neurological:     Mental Status: She is alert.  Psychiatric:        Mood and Affect: Mood normal.      UC Treatments / Results  Labs (all labs ordered are listed, but only abnormal results are displayed) Labs Reviewed - No data to  display  EKG   Radiology No results found.  Procedures Incision and Drainage  Date/Time: 04/06/2019 9:08 AM Performed by: Orvan July, NP Authorized by: Orvan July, NP   Consent:    Consent obtained:  Verbal   Consent given by:  Patient   Risks discussed:  Bleeding, pain, incomplete drainage and infection   Alternatives discussed:  No treatment Location:    Type:  Abscess   Size:  5   Location:  Upper extremity   Upper extremity location:  Arm (right axilla)   Arm location:  R upper arm Pre-procedure details:    Skin preparation:  Betadine Anesthesia (see MAR for exact dosages):    Anesthesia method:  Local infiltration   Local anesthetic:  Lidocaine 2% w/o epi Procedure type:    Complexity:  Simple Procedure details:    Needle aspiration: no     Incision types:  Stab incision   Scalpel blade:  11   Wound management:  Probed and deloculated   Drainage:  Bloody and purulent   Drainage amount:  Copious   Wound treatment:  Wound left open   Packing materials:  None Post-procedure details:    Patient tolerance of procedure:  Tolerated well, no immediate complications   (including critical care time)  Medications Ordered in UC Medications - No data to display  Initial Impression / Assessment and Plan / UC Course  I have reviewed the triage vital signs and the nursing notes.  Pertinent labs & imaging results that were available during my care of the patient were reviewed by me and considered in my medical decision making (see chart for details).     Abscess-I&D done here in clinic.  Patient tolerated well. Will place patient on Keflex and have her continue warm compresses to promote more drainage She can take ibuprofen for pain as needed Follow up as needed for continued or worsening symptoms  Final Clinical Impressions(s) / UC Diagnoses   Final diagnoses:  Abscess     Discharge  Instructions     We drained the abscess You can keep doing warm  compresses and express area to see if it with drain more Placing you on antibiotics.  You can take ibuprofen for the pain  Follow up as needed for continued or worsening symptoms     ED Prescriptions    Medication Sig Dispense Auth. Provider   cephALEXin (KEFLEX) 500 MG capsule Take 1 capsule (500 mg total) by mouth 4 (four) times daily. 28 capsule Sai Zinn A, NP     PDMP not reviewed this encounter.   Janace Aris, NP 04/06/19 [phone]

## 2019-04-07 ENCOUNTER — Ambulatory Visit (INDEPENDENT_AMBULATORY_CARE_PROVIDER_SITE_OTHER): Payer: Medicaid Other | Admitting: Nurse Practitioner

## 2019-04-07 ENCOUNTER — Other Ambulatory Visit: Payer: Self-pay

## 2019-04-07 ENCOUNTER — Encounter: Payer: Self-pay | Admitting: Nurse Practitioner

## 2019-04-07 DIAGNOSIS — Z0000 Encounter for general adult medical examination without abnormal findings: Secondary | ICD-10-CM | POA: Insufficient documentation

## 2019-04-07 NOTE — Progress Notes (Signed)
New Patient Office Visit  Subjective:  Patient ID: Kylie Wallace, female    DOB: May 25, 2000  Age: 19 y.o. MRN: [id number]  CC:  Chief Complaint  Patient presents with  . Establish Care    HPI Kylie Wallace presents establish care.  She is a 19 year old gravida 1 para 1. She was seen in urgent care had I&D right axillary.  She does believe it is because of shaving.  She denies previous history.  She had a fever and chills last night but this is resolved. She is taking the anbx, Keflex 4 times a day.  She is concerned with how she should get the 4 doses in.  She is concerned about the Keflex and breast-feeding. She is has a follow up apt in Feb with OBGYN.    Past Medical History:  Diagnosis Date  . Medical history non-contributory     Past Surgical History:  Procedure Laterality Date  . WISDOM TOOTH EXTRACTION      Family History  Problem Relation Age of Onset  . Hypertension Mother   . Healthy Father     Social History   Socioeconomic History  . Marital status: Single    Spouse name: Not on file  . Number of children: Not on file  . Years of education: Not on file  . Highest education level: Not on file  Occupational History  . Not on file  Tobacco Use  . Smoking status: Never Smoker  . Smokeless tobacco: Never Used  Substance and Sexual Activity  . Alcohol use: No  . Drug use: No  . Sexual activity: Yes  Other Topics Concern  . Not on file  Social History Narrative  . Not on file   Social Determinants of Health   Financial Resource Strain:   . Difficulty of Paying Living Expenses: Not on file  Food Insecurity: No Food Insecurity  . Worried About Charity fundraiser in the Last Year: Never true  . Ran Out of Food in the Last Year: Never true  Transportation Needs: No Transportation Needs  . Lack of Transportation (Medical): No  . Lack of Transportation (Non-Medical): No  Physical Activity:   . Days of Exercise per Week: Not on file  . Minutes of  Exercise per Session: Not on file  Stress:   . Feeling of Stress : Not on file  Social Connections:   . Frequency of Communication with Friends and Family: Not on file  . Frequency of Social Gatherings with Friends and Family: Not on file  . Attends Religious Services: Not on file  . Active Member of Clubs or Organizations: Not on file  . Attends Archivist Meetings: Not on file  . Marital Status: Not on file  Intimate Partner Violence: Not At Risk  . Fear of Current or Ex-Partner: No  . Emotionally Abused: No  . Physically Abused: No  . Sexually Abused: No    ROS Review of Systems  Constitutional: Positive for fever.  HENT: Negative.   Eyes: Negative.   Respiratory: Negative.   Cardiovascular: Negative.   Endocrine: Negative.   Genitourinary: Negative.   Allergic/Immunologic: Negative.   Neurological: Negative.   Hematological: Negative.     Objective:   Today's Vitals: BP 115/72 (BP Location: Left Arm, Patient Position: Sitting, Cuff Size: Normal)   Pulse 87   Temp 99.6 F (37.6 C) (Oral)   Resp 14   Ht 5\' 4"  (1.626 m)   Wt 196 lb (88.9 kg)  LMP 03/02/2019   SpO2 100%   BMI 33.64 kg/m   Physical Exam Constitutional:      Appearance: Normal appearance.  HENT:     Head: Normocephalic.     Nose: Nose normal.     Mouth/Throat:     Mouth: Mucous membranes are dry.  Eyes:     Pupils: Pupils are equal, round, and reactive to light.  Cardiovascular:     Rate and Rhythm: Normal rate and regular rhythm.  Pulmonary:     Effort: Pulmonary effort is normal.  Abdominal:     General: Bowel sounds are normal.     Palpations: Abdomen is soft.  Musculoskeletal:        General: Normal range of motion.     Cervical back: Normal range of motion.  Skin:    General: Skin is warm and dry.     Capillary Refill: Capillary refill takes less than 2 seconds.          Comments: Clean dry bandage with no visible drainage.  No tenderness with palpation,no induration   Neurological:     General: No focal deficit present.     Mental Status: She is alert and oriented to person, place, and time.  Psychiatric:        Mood and Affect: Mood normal.        Behavior: Behavior normal.        Thought Content: Thought content normal.        Judgment: Judgment normal.     Assessment & Plan:   Problem List Items Addressed This Visit      Low   RESOLVED: Encounter for medical examination to establish care    Discussed healthy diet and breast-feeding Instructed patient to take Keflex every 6 hours until complete Follow-up as needed         Outpatient Encounter Medications as of 04/07/2019  Medication Sig  . cephALEXin (KEFLEX) 500 MG capsule Take 1 capsule (500 mg total) by mouth 4 (four) times daily.  . [DISCONTINUED] norethindrone (MICRONOR) 0.35 MG tablet Take 1 tablet (0.35 mg total) by mouth daily.  . [DISCONTINUED] Prenatal Vit-Fe Fumarate-FA (MULTIVITAMIN-PRENATAL) 27-0.8 MG TABS tablet Take 1 tablet by mouth daily at 12 noon.  . [DISCONTINUED] silver sulfADIAZINE (SILVADENE) 1 % cream Apply 1 application topically daily.   No facility-administered encounter medications on file as of 04/07/2019.    Follow-up: Return for follow up as needed.   Barbette Merino, NP

## 2019-04-07 NOTE — Patient Instructions (Addendum)
Healthy Eating Following a healthy eating pattern may help you to achieve and maintain a healthy body weight, reduce the risk of chronic disease, and live a long and productive life. It is important to follow a healthy eating pattern at an appropriate calorie level for your body. Your nutritional needs should be met primarily through food by choosing a variety of nutrient-rich foods. What are tips for following this plan? Reading food labels  Read labels and choose the following: ? Reduced or low sodium. ? Juices with 100% fruit juice. ? Foods with low saturated fats and high polyunsaturated and monounsaturated fats. ? Foods with whole grains, such as whole wheat, cracked wheat, brown rice, and wild rice. ? Whole grains that are fortified with folic acid. This is recommended for women who are pregnant or who want to become pregnant.  Read labels and avoid the following: ? Foods with a lot of added sugars. These include foods that contain brown sugar, corn sweetener, corn syrup, dextrose, fructose, glucose, high-fructose corn syrup, honey, invert sugar, lactose, malt syrup, maltose, molasses, raw sugar, sucrose, trehalose, or turbinado sugar.  Do not eat more than the following amounts of added sugar per day:  6 teaspoons (25 g) for women.  9 teaspoons (38 g) for men. ? Foods that contain processed or refined starches and grains. ? Refined grain products, such as white flour, degermed cornmeal, white bread, and white rice. Shopping  Choose nutrient-rich snacks, such as vegetables, whole fruits, and nuts. Avoid high-calorie and high-sugar snacks, such as potato chips, fruit snacks, and candy.  Use oil-based dressings and spreads on foods instead of solid fats such as butter, stick margarine, or cream cheese.  Limit pre-made sauces, mixes, and "instant" products such as flavored rice, instant noodles, and ready-made pasta.  Try more plant-protein sources, such as tofu, tempeh, black beans,  edamame, lentils, nuts, and seeds.  Explore eating plans such as the Mediterranean diet or vegetarian diet. Cooking  Use oil to saut or stir-fry foods instead of solid fats such as butter, stick margarine, or lard.  Try baking, boiling, grilling, or broiling instead of frying.  Remove the fatty part of meats before cooking.  Steam vegetables in water or broth. Meal planning   At meals, imagine dividing your plate into fourths: ? One-half of your plate is fruits and vegetables. ? One-fourth of your plate is whole grains. ? One-fourth of your plate is protein, especially lean meats, poultry, eggs, tofu, beans, or nuts.  Include low-fat dairy as part of your daily diet. Lifestyle  Choose healthy options in all settings, including home, work, school, restaurants, or stores.  Prepare your food safely: ? Wash your hands after handling raw meats. ? Keep food preparation surfaces clean by regularly washing with hot, soapy water. ? Keep raw meats separate from ready-to-eat foods, such as fruits and vegetables. ? Cook seafood, meat, poultry, and eggs to the recommended internal temperature. ? Store foods at safe temperatures. In general:  Keep cold foods at 40F (4.4C) or below.  Keep hot foods at 140F (60C) or above.  Keep your freezer at 0F (-17.8C) or below.  Foods are no longer safe to eat when they have been between the temperatures of 40-140F (4.4-60C) for more than 2 hours. What foods should I eat? Fruits Aim to eat 2 cup-equivalents of fresh, canned (in natural juice), or frozen fruits each day. Examples of 1 cup-equivalent of fruit include 1 small apple, 8 large strawberries, 1 cup canned fruit,  cup   dried fruit, or 1 cup 100% juice. Vegetables Aim to eat 2-3 cup-equivalents of fresh and frozen vegetables each day, including different varieties and colors. Examples of 1 cup-equivalent of vegetables include 2 medium carrots, 2 cups raw, leafy greens, 1 cup chopped  vegetable (raw or cooked), or 1 medium baked potato. Grains Aim to eat 6 ounce-equivalents of whole grains each day. Examples of 1 ounce-equivalent of grains include 1 slice of bread, 1 cup ready-to-eat cereal, 3 cups popcorn, or  cup cooked rice, pasta, or cereal. Meats and other proteins Aim to eat 5-6 ounce-equivalents of protein each day. Examples of 1 ounce-equivalent of protein include 1 egg, 1/2 cup nuts or seeds, or 1 tablespoon (16 g) peanut butter. A cut of meat or fish that is the size of a deck of cards is about 3-4 ounce-equivalents.  Of the protein you eat each week, try to have at least 8 ounces come from seafood. This includes salmon, trout, herring, and anchovies. Dairy Aim to eat 3 cup-equivalents of fat-free or low-fat dairy each day. Examples of 1 cup-equivalent of dairy include 1 cup (240 mL) milk, 8 ounces (250 g) yogurt, 1 ounces (44 g) natural cheese, or 1 cup (240 mL) fortified soy milk. Fats and oils  Aim for about 5 teaspoons (21 g) per day. Choose monounsaturated fats, such as canola and olive oils, avocados, peanut butter, and most nuts, or polyunsaturated fats, such as sunflower, corn, and soybean oils, walnuts, pine nuts, sesame seeds, sunflower seeds, and flaxseed. Beverages  Aim for six 8-oz glasses of water per day. Limit coffee to three to five 8-oz cups per day.  Limit caffeinated beverages that have added calories, such as soda and energy drinks.  Limit alcohol intake to no more than 1 drink a day for nonpregnant women and 2 drinks a day for men. One drink equals 12 oz of beer (355 mL), 5 oz of wine (148 mL), or 1 oz of hard liquor (44 mL). Seasoning and other foods  Avoid adding excess amounts of salt to your foods. Try flavoring foods with herbs and spices instead of salt.  Avoid adding sugar to foods.  Try using oil-based dressings, sauces, and spreads instead of solid fats. This information is based on general [place] nutrition guidelines. For more  information, visit choosemyplate.gov. Exact amounts may vary based on your nutrition needs. Summary  A healthy eating plan may help you to maintain a healthy weight, reduce the risk of chronic diseases, and stay active throughout your life.  Plan your meals. Make sure you eat the right portions of a variety of nutrient-rich foods.  Try baking, boiling, grilling, or broiling instead of frying.  Choose healthy options in all settings, including home, work, school, restaurants, or stores. This information is not intended to replace advice given to you by your health care provider. Make sure you discuss any questions you have with your health care provider. Document Revised: 06/16/2017 Document Reviewed: 06/16/2017 Elsevier Patient Education  2020 [employer]  

## 2019-04-07 NOTE — Assessment & Plan Note (Signed)
Discussed healthy diet and breast-feeding Instructed patient to take Keflex every 6 hours until complete Follow-up as needed

## 2019-05-10 ENCOUNTER — Ambulatory Visit: Payer: Medicaid Other | Admitting: Advanced Practice Midwife

## 2019-06-01 ENCOUNTER — Ambulatory Visit: Payer: Medicaid Other | Admitting: Women's Health

## 2019-10-15 ENCOUNTER — Ambulatory Visit (HOSPITAL_COMMUNITY)
Admission: EM | Admit: 2019-10-15 | Discharge: 2019-10-15 | Disposition: A | Discharge status: Home / Self Care | Payer: Medicaid Other | Attending: Emergency Medicine | Admitting: Emergency Medicine

## 2019-10-15 ENCOUNTER — Other Ambulatory Visit: Payer: Self-pay

## 2019-10-15 ENCOUNTER — Encounter (HOSPITAL_COMMUNITY): Payer: Self-pay

## 2019-10-15 DIAGNOSIS — Z79899 Other long term (current) drug therapy: Secondary | ICD-10-CM | POA: Diagnosis not present

## 2019-10-15 DIAGNOSIS — J069 Acute upper respiratory infection, unspecified: Secondary | ICD-10-CM | POA: Diagnosis not present

## 2019-10-15 DIAGNOSIS — Z20822 Contact with and (suspected) exposure to covid-19: Secondary | ICD-10-CM | POA: Diagnosis not present

## 2019-10-15 DIAGNOSIS — K0889 Other specified disorders of teeth and supporting structures: Secondary | ICD-10-CM

## 2019-10-15 DIAGNOSIS — Z791 Long term (current) use of non-steroidal anti-inflammatories (NSAID): Secondary | ICD-10-CM | POA: Diagnosis not present

## 2019-10-15 DIAGNOSIS — H60332 Swimmer's ear, left ear: Secondary | ICD-10-CM

## 2019-10-15 LAB — SARS CORONAVIRUS 2 (TAT 6-24 HRS): SARS Coronavirus 2: NEGATIVE

## 2019-10-15 MED ORDER — AMOXICILLIN-POT CLAVULANATE 875-125 MG PO TABS: 1.0000 | ORAL_TABLET | Freq: Two times a day (BID) | ORAL | 0 refills | Status: AC

## 2019-10-15 MED ORDER — IBUPROFEN 800 MG PO TABS: 800.0000 mg | ORAL_TABLET | Freq: Three times a day (TID) | ORAL | 0 refills | Status: DC

## 2019-10-15 MED ORDER — FLUTICASONE PROPIONATE 50 MCG/ACT NA SUSP: 1.0000 | Freq: Every day | NASAL | 2 refills | Status: DC

## 2019-10-15 MED ORDER — NEOMYCIN-POLYMYXIN-HC 3.5-10000-1 OT SUSP: 3.0000 [drp] | Freq: Three times a day (TID) | OTIC | 0 refills | Status: AC

## 2019-10-15 NOTE — Discharge Instructions (Signed)
I am going to treat for infection to your ear canal as well as concern for dental infection.  This would also treat any sinus infection.  Nasal spray can help with your ear pain as well.  Ibuprofen for aches and pain. Take with food.

## 2019-10-15 NOTE — ED Triage Notes (Signed)
Pt c/o left ear pain since yesterday and awoke today with decreased hearing in left ear. Also reports toothache to upper left area last week.  Also reports sore throat, HA, congestion, runny nose, body aches since last week. Fever on Saturday.  Last dose tylenol yesterday.   Denies abdominal pain, n/v/d.

## 2019-10-16 ENCOUNTER — Other Ambulatory Visit: Payer: Self-pay

## 2019-10-16 ENCOUNTER — Emergency Department (HOSPITAL_COMMUNITY)
Admission: EM | Admit: 2019-10-16 | Discharge: 2019-10-17 | Disposition: A | Discharge status: Home / Self Care | Payer: Medicaid Other | Attending: Emergency Medicine | Admitting: Emergency Medicine

## 2019-10-16 ENCOUNTER — Encounter (HOSPITAL_COMMUNITY): Payer: Self-pay

## 2019-10-16 DIAGNOSIS — Z5321 Procedure and treatment not carried out due to patient leaving prior to being seen by health care provider: Secondary | ICD-10-CM | POA: Diagnosis not present

## 2019-10-16 DIAGNOSIS — H9202 Otalgia, left ear: Secondary | ICD-10-CM | POA: Insufficient documentation

## 2019-10-16 DIAGNOSIS — R509 Fever, unspecified: Secondary | ICD-10-CM | POA: Diagnosis not present

## 2019-10-16 NOTE — ED Provider Notes (Signed)
[facility]    CSN: [id number] Arrival date & time: 10/15/19  1137      History   Chief Complaint Chief Complaint  Patient presents with   Otalgia    HPI Kylie Wallace is a 19 y.o. female.   Kylie Wallace presents with complaints of left ear pain which started yesterday. Decreased hearing. Nasal drainage and low grade temp 6 days ago. No fever since. She feels that her legs have been achy/sore. Some cough. Headache. No rash. No gi symptoms. Some dental pain, left lower teeth. No recent swimming or underwater submersion. No known ill contacts. No history of covid-19 and has not received vaccination. Hasn't taken any medications for symptoms.     ROS per HPI, negative if not otherwise mentioned.         Past Medical History:  Diagnosis Date   Medical history non-contributory     There are no problems to display for this patient.   Past Surgical History:  Procedure Laterality Date   WISDOM TOOTH EXTRACTION      OB History    Gravida  1   Para  1   Term  1   Preterm  0   AB  0   Living  1     SAB  0   TAB  0   Ectopic  0   Multiple  0   Live Births  1            Home Medications    Prior to Admission medications   Medication Sig Start Date End Date Taking? Authorizing Provider  amoxicillin-clavulanate (AUGMENTIN) 875-125 MG tablet Take 1 tablet by mouth every 12 (twelve) hours for 7 days. 10/15/19 10/22/19  Georgetta Haber, NP  cephALEXin (KEFLEX) 500 MG capsule Take 1 capsule (500 mg total) by mouth 4 (four) times daily. 04/06/19   Bast, Gloris Manchester A, NP  fluticasone (FLONASE) 50 MCG/ACT nasal spray Place 1 spray into both nostrils daily. 10/15/19   Georgetta Haber, NP  ibuprofen (ADVIL) 800 MG tablet Take 1 tablet (800 mg total) by mouth 3 (three) times daily. 10/15/19   Georgetta Haber, NP  neomycin-polymyxin-hydrocortisone (CORTISPORIN) 3.5-10000-1 OTIC suspension Place 3 drops into the left ear 3 (three) times daily for 10 days.  10/15/19 10/25/19  Georgetta Haber, NP  norethindrone (MICRONOR) 0.35 MG tablet Take 1 tablet (0.35 mg total) by mouth daily. 11/10/18 01/04/19  Dorathy Kinsman, CNM    Family History Family History  Problem Relation Age of Onset   Hypertension Mother    Healthy Father     Social History Social History   Tobacco Use   Smoking status: Never Smoker   Smokeless tobacco: Never Used  Building services engineer Use: Never used  Substance Use Topics   Alcohol use: No   Drug use: No     Allergies   Patient has no known allergies.   Review of Systems Review of Systems   Physical Exam Triage Vital Signs ED Triage Vitals [10/15/19 1333]  Enc Vitals Group     BP 110/68     Pulse Rate 105     Resp 18     Temp 98.4 F (36.9 C)     Temp Source Oral     SpO2 100 %     Weight      Height      Head Circumference      Peak Flow      Pain Score 10  Pain Loc      Pain Edu?      Excl. in GC?    No data found.  Updated Vital Signs BP 110/68 (BP Location: Right Arm)    Pulse 105    Temp 98.4 F (36.9 C) (Oral)    Resp 18    LMP 10/13/2019    SpO2 100%   Visual Acuity Right Eye Distance:   Left Eye Distance:   Bilateral Distance:    Right Eye Near:   Left Eye Near:    Bilateral Near:     Physical Exam Constitutional:      General: She is not in acute distress.    Appearance: She is well-developed.  HENT:     Head:     Jaw: Pain on movement present.     Comments: Some dental and left ear pain with movement of jaw    Left Ear: Tenderness present. No mastoid tenderness.     Ears:     Comments: Left canal is very red and very tender, no significant drainage or swelling     Mouth/Throat:     Comments: Left lower jaw tenderness on palpation, no specific visible abscess presence;  dental tenderness  Cardiovascular:     Rate and Rhythm: Normal rate.  Pulmonary:     Effort: Pulmonary effort is normal.  Skin:    General: Skin is warm and dry.  Neurological:      Mental Status: She is alert and oriented to person, place, and time.      UC Treatments / Results  Labs (all labs ordered are listed, but only abnormal results are displayed) Labs Reviewed  SARS CORONAVIRUS 2 (TAT 6-24 HRS)    EKG   Radiology No results found.  Procedures Procedures (including critical care time)  Medications Ordered in UC Medications - No data to display  Initial Impression / Assessment and Plan / UC Course  I have reviewed the triage vital signs and the nursing notes.  Pertinent labs & imaging results that were available during my care of the patient were reviewed by me and considered in my medical decision making (see chart for details).     Appears to have a mild left AOE, as well as left dental pain. augmentin provided as well as ear drops. Supportive cares recommended. Return precautions provided. Patient verbalized understanding and agreeable to plan.   Final Clinical Impressions(s) / UC Diagnoses   Final diagnoses:  Acute swimmer's ear of left side  Pain, dental  Upper respiratory tract infection, unspecified type     Discharge Instructions     I am going to treat for infection to your ear canal as well as concern for dental infection.  This would also treat any sinus infection.  Nasal spray can help with your ear pain as well.  Ibuprofen for aches and pain. Take with food.    ED Prescriptions    Medication Sig Dispense Auth. Provider   neomycin-polymyxin-hydrocortisone (CORTISPORIN) 3.5-10000-1 OTIC suspension Place 3 drops into the left ear 3 (three) times daily for 10 days. 10 mL Linus Mako B, NP   amoxicillin-clavulanate (AUGMENTIN) 875-125 MG tablet Take 1 tablet by mouth every 12 (twelve) hours for 7 days. 14 tablet Linus Mako B, NP   ibuprofen (ADVIL) 800 MG tablet Take 1 tablet (800 mg total) by mouth 3 (three) times daily. 30 tablet Linus Mako B, NP   fluticasone (FLONASE) 50 MCG/ACT nasal spray Place 1 spray into  both nostrils daily. 16  g Georgetta Haber, NP     PDMP not reviewed this encounter.   Georgetta Haber, NP 10/16/19 2115

## 2019-10-16 NOTE — ED Triage Notes (Signed)
Pt arrives to ED w/ c/o L ear pain that started Thursday. Pt endorses intermittent fever.

## 2019-10-17 NOTE — ED Notes (Signed)
Pt called for VS no response.  

## 2020-04-03 ENCOUNTER — Telehealth (INDEPENDENT_AMBULATORY_CARE_PROVIDER_SITE_OTHER): Payer: Medicaid Other | Admitting: Nurse Practitioner

## 2020-04-03 ENCOUNTER — Other Ambulatory Visit: Payer: Self-pay

## 2020-04-03 NOTE — Progress Notes (Signed)
   [facility] Patient [facility] [address] [place], [place]  [address] Phone:  [phone]   Fax:  [phone]  Call placed to Ms Filip for her virtual visit. I was unable to connect with her today. I did leave a message to call our office an reschedule her annual visit.

## 2020-05-04 ENCOUNTER — Ambulatory Visit (HOSPITAL_COMMUNITY)
Admission: EM | Admit: 2020-05-04 | Discharge: 2020-05-04 | Disposition: A | Discharge status: Home / Self Care | Payer: Medicaid Other | Attending: Family Medicine | Admitting: Family Medicine

## 2020-05-04 ENCOUNTER — Encounter (HOSPITAL_COMMUNITY): Payer: Self-pay

## 2020-05-04 DIAGNOSIS — R6883 Chills (without fever): Secondary | ICD-10-CM

## 2020-05-04 DIAGNOSIS — Z793 Long term (current) use of hormonal contraceptives: Secondary | ICD-10-CM | POA: Diagnosis not present

## 2020-05-04 DIAGNOSIS — R112 Nausea with vomiting, unspecified: Secondary | ICD-10-CM | POA: Diagnosis present

## 2020-05-04 DIAGNOSIS — Z20822 Contact with and (suspected) exposure to covid-19: Secondary | ICD-10-CM | POA: Insufficient documentation

## 2020-05-04 DIAGNOSIS — R197 Diarrhea, unspecified: Secondary | ICD-10-CM

## 2020-05-04 DIAGNOSIS — Z79899 Other long term (current) drug therapy: Secondary | ICD-10-CM | POA: Insufficient documentation

## 2020-05-04 DIAGNOSIS — Z791 Long term (current) use of non-steroidal anti-inflammatories (NSAID): Secondary | ICD-10-CM | POA: Diagnosis not present

## 2020-05-04 LAB — SARS CORONAVIRUS 2 (TAT 6-24 HRS): SARS Coronavirus 2: NEGATIVE

## 2020-05-04 MED ORDER — ONDANSETRON 4 MG PO TBDP: 4.0000 mg | ORAL_TABLET | Freq: Three times a day (TID) | ORAL | 0 refills | Status: DC | PRN

## 2020-05-04 NOTE — ED Provider Notes (Signed)
[facility]    CSN: [id number] Arrival date & time: 05/04/20  0805      History   Chief Complaint Chief Complaint  Patient presents with  . Emesis  . Diarrhea  . Chills    HPI Kylie Wallace is a 20 y.o. female.   Here today with vomiting, nausea, diarrhea, body aches, and chills since last night. Denies abdominal pain, fever, hematemesis or melena, cough, CP, SOB, sick contacts, hx of chronic GI issues, new foods. So far not trying anything OTC for sxs. LMP 04/27/20 and no concern for pregnancy, STIs or other vaginal infections. Is breastfeeding.      Past Medical History:  Diagnosis Date  . Medical history non-contributory     There are no problems to display for this patient.   Past Surgical History:  Procedure Laterality Date  . WISDOM TOOTH EXTRACTION      OB History    Gravida  1   Para  1   Term  1   Preterm  0   AB  0   Living  1     SAB  0   IAB  0   Ectopic  0   Multiple  0   Live Births  1            Home Medications    Prior to Admission medications   Medication Sig Start Date End Date Taking? Authorizing Provider  ondansetron (ZOFRAN ODT) 4 MG disintegrating tablet Take 1 tablet (4 mg total) by mouth every 8 (eight) hours as needed for nausea or vomiting. 05/04/20  Yes Particia Nearing, PA-C  cephALEXin (KEFLEX) 500 MG capsule Take 1 capsule (500 mg total) by mouth 4 (four) times daily. 04/06/19   Bast, Gloris Manchester A, NP  fluticasone (FLONASE) 50 MCG/ACT nasal spray Place 1 spray into both nostrils daily. 10/15/19   Georgetta Haber, NP  ibuprofen (ADVIL) 800 MG tablet Take 1 tablet (800 mg total) by mouth 3 (three) times daily. 10/15/19   Georgetta Haber, NP  norethindrone (MICRONOR) 0.35 MG tablet Take 1 tablet (0.35 mg total) by mouth daily. 11/10/18 01/04/19  Dorathy Kinsman, CNM    Family History Family History  Problem Relation Age of Onset  . Hypertension Mother   . Healthy Father     Social  History Social History   Tobacco Use  . Smoking status: Never Smoker  . Smokeless tobacco: Never Used  Vaping Use  . Vaping Use: Never used  Substance Use Topics  . Alcohol use: No  . Drug use: No     Allergies   Patient has no known allergies.   Review of Systems Review of Systems PER HPI    Physical Exam Triage Vital Signs ED Triage Vitals  Enc Vitals Group     BP 05/04/20 0823 113/71     Pulse Rate 05/04/20 0823 (!) 103     Resp 05/04/20 0823 20     Temp 05/04/20 0823 98.6 F (37 C)     Temp Source 05/04/20 0823 Oral     SpO2 05/04/20 0823 99 %     Weight --      Height --      Head Circumference --      Peak Flow --      Pain Score 05/04/20 0821 5     Pain Loc --      Pain Edu? --      Excl. in GC? --  No data found.  Updated Vital Signs BP 113/71 (BP Location: Right Arm)   Pulse (!) 103   Temp 98.6 F (37 C) (Oral)   Resp 20   LMP 04/27/2020 (Approximate)   SpO2 99%   Visual Acuity Right Eye Distance:   Left Eye Distance:   Bilateral Distance:    Right Eye Near:   Left Eye Near:    Bilateral Near:     Physical Exam Vitals and nursing note reviewed.  Constitutional:      Appearance: Normal appearance. She is not ill-appearing.  HENT:     Head: Atraumatic.     Mouth/Throat:     Mouth: Mucous membranes are moist.     Pharynx: Oropharynx is clear.  Eyes:     Extraocular Movements: Extraocular movements intact.     Conjunctiva/sclera: Conjunctivae normal.  Cardiovascular:     Rate and Rhythm: Normal rate and regular rhythm.     Heart sounds: Normal heart sounds.  Pulmonary:     Effort: Pulmonary effort is normal.     Breath sounds: Normal breath sounds.  Abdominal:     General: Bowel sounds are normal. There is no distension.     Palpations: Abdomen is soft.     Tenderness: There is no abdominal tenderness. There is no right CVA tenderness, left CVA tenderness or guarding.  Musculoskeletal:        General: Normal range of  motion.     Cervical back: Normal range of motion and neck supple.  Skin:    General: Skin is warm and dry.  Neurological:     Mental Status: She is alert and oriented to person, place, and time.  Psychiatric:        Mood and Affect: Mood normal.        Thought Content: Thought content normal.        Judgment: Judgment normal.     UC Treatments / Results  Labs (all labs ordered are listed, but only abnormal results are displayed) Labs Reviewed  SARS CORONAVIRUS 2 (TAT 6-24 HRS)    EKG   Radiology No results found.  Procedures Procedures (including critical care time)  Medications Ordered in UC Medications - No data to display  Initial Impression / Assessment and Plan / UC Course  I have reviewed the triage vital signs and the nursing notes.  Pertinent labs & imaging results that were available during my care of the patient were reviewed by me and considered in my medical decision making (see chart for details).     Vitals and exam reassuring today, suspect viral GI illness but will r/o COVID - PCR test pending. Work note given, zofran sent for prn use for nausea, BRAT diet, push fluids. Return precautions given for worsening sxs.   Final Clinical Impressions(s) / UC Diagnoses   Final diagnoses:  Nausea vomiting and diarrhea  Chills   Discharge Instructions   None    ED Prescriptions    Medication Sig Dispense Auth. Provider   ondansetron (ZOFRAN ODT) 4 MG disintegrating tablet Take 1 tablet (4 mg total) by mouth every 8 (eight) hours as needed for nausea or vomiting. 20 tablet Particia Nearing, [place]     PDMP not reviewed this encounter.   Particia Nearing, [place] 05/04/20 1324

## 2020-05-04 NOTE — ED Triage Notes (Signed)
Pt presents with vomiting and diarrhea last night after eating dinner. Pt states last night she had chills. Pt denies fever. Pt c/o abdominal cramping and back pain.

## 2020-07-10 ENCOUNTER — Other Ambulatory Visit: Payer: Self-pay

## 2020-07-10 ENCOUNTER — Ambulatory Visit (HOSPITAL_COMMUNITY)
Admission: EM | Admit: 2020-07-10 | Discharge: 2020-07-10 | Disposition: A | Discharge status: Home / Self Care | Payer: Medicaid Other | Attending: Urgent Care | Admitting: Urgent Care

## 2020-07-10 ENCOUNTER — Encounter (HOSPITAL_COMMUNITY): Payer: Self-pay

## 2020-07-10 ENCOUNTER — Ambulatory Visit (HOSPITAL_COMMUNITY): Payer: Self-pay

## 2020-07-10 DIAGNOSIS — H9201 Otalgia, right ear: Secondary | ICD-10-CM | POA: Diagnosis not present

## 2020-07-10 DIAGNOSIS — K047 Periapical abscess without sinus: Secondary | ICD-10-CM | POA: Diagnosis not present

## 2020-07-10 DIAGNOSIS — R519 Headache, unspecified: Secondary | ICD-10-CM

## 2020-07-10 DIAGNOSIS — R6883 Chills (without fever): Secondary | ICD-10-CM

## 2020-07-10 DIAGNOSIS — K0889 Other specified disorders of teeth and supporting structures: Secondary | ICD-10-CM | POA: Diagnosis not present

## 2020-07-10 LAB — POC INFLUENZA A AND B ANTIGEN (URGENT CARE ONLY)
INFLUENZA A ANTIGEN, POC: NEGATIVE
INFLUENZA B ANTIGEN, POC: NEGATIVE

## 2020-07-10 MED ORDER — AMOXICILLIN-POT CLAVULANATE 875-125 MG PO TABS: 1.0000 | ORAL_TABLET | Freq: Two times a day (BID) | ORAL | 0 refills | Status: DC

## 2020-07-10 MED ORDER — ACETAMINOPHEN 325 MG PO TABS
650.0000 mg | ORAL_TABLET | Freq: Once | ORAL | Status: AC
  Administered 2020-07-10: 650 mg via ORAL

## 2020-07-10 MED ORDER — ACETAMINOPHEN 325 MG PO TABS
ORAL_TABLET | ORAL | Status: AC
  Filled 2020-07-10: qty 2

## 2020-07-10 MED ORDER — NAPROXEN 500 MG PO TABS: 500.0000 mg | ORAL_TABLET | Freq: Two times a day (BID) | ORAL | 0 refills | Status: DC

## 2020-07-10 MED ORDER — NAPROXEN 375 MG PO TABS: 375.0000 mg | ORAL_TABLET | Freq: Two times a day (BID) | ORAL | 0 refills | Status: DC

## 2020-07-10 NOTE — Discharge Instructions (Signed)
Make sure you schedule an appointment with a dentist/dental surgeon as soon as possible.  You may try some of the resources below.     [facility] [phone] extension [phone]  Dr. Civils [phone] [address]  [facility] [phone] [address]  [facility] [phone] extension [phone] [place], [place], [address] First come first serve for the first 10 clients.  May do simple extractions only, no wisdom teeth or surgery.  You may try the second for Thursday of the month starting at 6:30 AM.  [employer] You may call the school to see if they are still helping to provide dental care for emergent cases.  

## 2020-07-10 NOTE — ED Provider Notes (Signed)
[facility]   MRN: [id number] DOB: 02-Dec-2000  Subjective:   Annjanette Wertenberger is a 20 y.o. female presenting for 2 day history of acute onset right ear pain, headache, chills. Has also had right lower jaw pain, posterior molar pain and bottom right side. Denies cough, chest pain, shob, body aches.   No current facility-administered medications for this encounter.  Current Outpatient Medications:  .  cephALEXin (KEFLEX) 500 MG capsule, Take 1 capsule (500 mg total) by mouth 4 (four) times daily., Disp: 28 capsule, Rfl: 0 .  fluticasone (FLONASE) 50 MCG/ACT nasal spray, Place 1 spray into both nostrils daily., Disp: 16 g, Rfl: 2 .  ibuprofen (ADVIL) 800 MG tablet, Take 1 tablet (800 mg total) by mouth 3 (three) times daily., Disp: 30 tablet, Rfl: 0 .  ondansetron (ZOFRAN ODT) 4 MG disintegrating tablet, Take 1 tablet (4 mg total) by mouth every 8 (eight) hours as needed for nausea or vomiting., Disp: 20 tablet, Rfl: 0   No Known Allergies  Past Medical History:  Diagnosis Date  . Medical history non-contributory      Past Surgical History:  Procedure Laterality Date  . WISDOM TOOTH EXTRACTION      Family History  Problem Relation Age of Onset  . Hypertension Mother   . Healthy Father     Social History   Tobacco Use  . Smoking status: Never Smoker  . Smokeless tobacco: Never Used  Vaping Use  . Vaping Use: Never used  Substance Use Topics  . Alcohol use: No  . Drug use: No    ROS   Objective:   Vitals: BP 128/73 (BP Location: Right Arm)   Pulse (!) 101   Temp (!) 102 F (38.9 C) (Oral)   Resp 19   LMP  (Within Weeks) Comment: 1 week  SpO2 100%   Breastfeeding No   Physical Exam Constitutional:      General: She is not in acute distress.    Appearance: Normal appearance. She is well-developed. She is not ill-appearing, toxic-appearing or diaphoretic.  HENT:     Head: Normocephalic and atraumatic.      Right Ear: Tympanic membrane and  ear canal normal. No drainage or tenderness. No middle ear effusion. Tympanic membrane is not erythematous.     Left Ear: Tympanic membrane and ear canal normal. No drainage or tenderness.  No middle ear effusion. Tympanic membrane is not erythematous.     Nose: Nose normal. No congestion or rhinorrhea.     Mouth/Throat:     Mouth: Mucous membranes are moist. No oral lesions.     Pharynx: No pharyngeal swelling, oropharyngeal exudate, posterior oropharyngeal erythema or uvula swelling.     Tonsils: No tonsillar exudate or tonsillar abscesses.   Eyes:     General: No scleral icterus.       Right eye: No discharge.        Left eye: No discharge.     Extraocular Movements: Extraocular movements intact.     Right eye: Normal extraocular motion.     Left eye: Normal extraocular motion.     Conjunctiva/sclera: Conjunctivae normal.     Pupils: Pupils are equal, round, and reactive to light.  Cardiovascular:     Rate and Rhythm: Normal rate and regular rhythm.     Pulses: Normal pulses.     Heart sounds: Normal heart sounds. No murmur heard. No friction rub. No gallop.   Pulmonary:     Effort: Pulmonary effort  is normal. No respiratory distress.     Breath sounds: Normal breath sounds. No stridor. No wheezing, rhonchi or rales.  Musculoskeletal:     Cervical back: Normal range of motion and neck supple.  Lymphadenopathy:     Cervical: No cervical adenopathy.  Skin:    General: Skin is warm and dry.     Findings: No rash.  Neurological:     General: No focal deficit present.     Mental Status: She is alert and oriented to person, place, and time.     Cranial Nerves: No cranial nerve deficit.     Motor: No weakness.     Coordination: Coordination normal.     Gait: Gait normal.     Deep Tendon Reflexes: Reflexes normal.  Psychiatric:        Mood and Affect: Mood normal.        Behavior: Behavior normal.        Thought Content: Thought content normal.        Judgment: Judgment normal.     Patient given PO Tylenol in clinic.   Results for orders placed or performed during the hospital encounter of 07/10/20 (from the past 24 hour(s))  POC Influenza A & B Ag (Urgent Care)     Status: None   Collection Time: 07/10/20  3:17 PM  Result Value Ref Range   INFLUENZA A ANTIGEN, POC NEGATIVE NEGATIVE   INFLUENZA B ANTIGEN, POC NEGATIVE NEGATIVE    Assessment and Plan :   PDMP not reviewed this encounter.  1. Dental infection   2. Pain, dental   3. Facial pain   4. Right ear pain     Start Augmentin for dental infection/abscess, use naproxen for pain and inflammation. Emphasized need for dental surgeon consult. Counseled patient on potential for adverse effects with medications prescribed/recommended today, strict ER and return-to-clinic precautions discussed, patient verbalized understanding.    Wallis Bamberg, [place] 07/10/20 1537

## 2020-07-10 NOTE — ED Triage Notes (Signed)
Pt reports right ear pain headache and chills x 2 days. Ibuprofen gives some relief.

## 2020-08-09 IMAGING — US US MFM OB FOLLOW UP
1 series · 14 of 28 positions shown · non-contrast
Comparison: none

[Series 1: us mfm ob follow up · 14 of 52 slices shown]
[im 2/52]
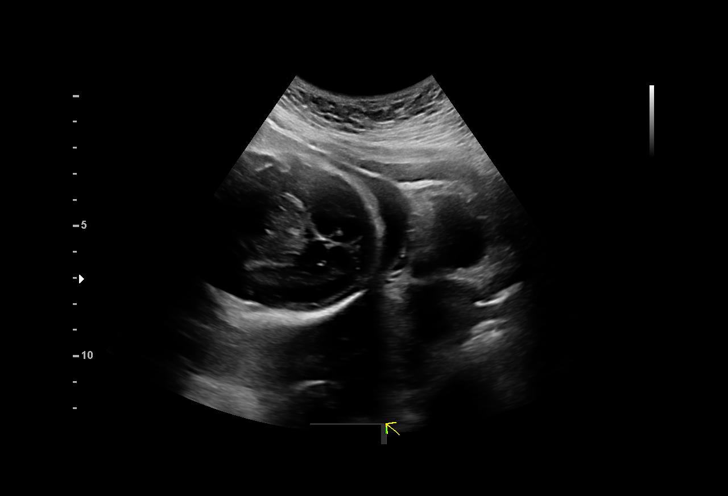
[im 6/52]
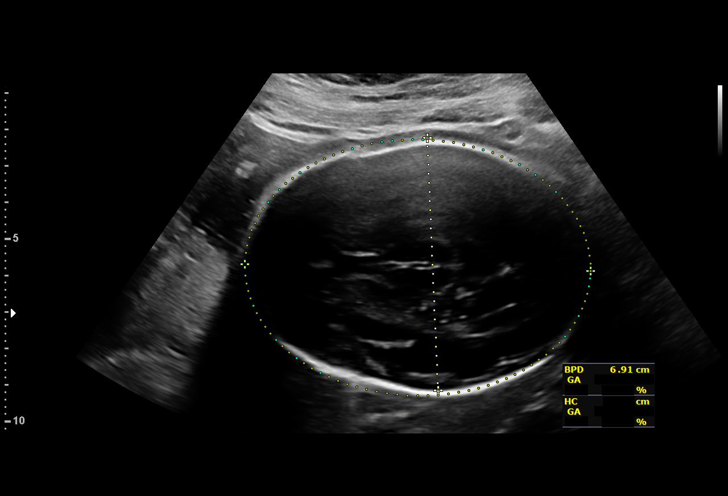
[im 10/52]
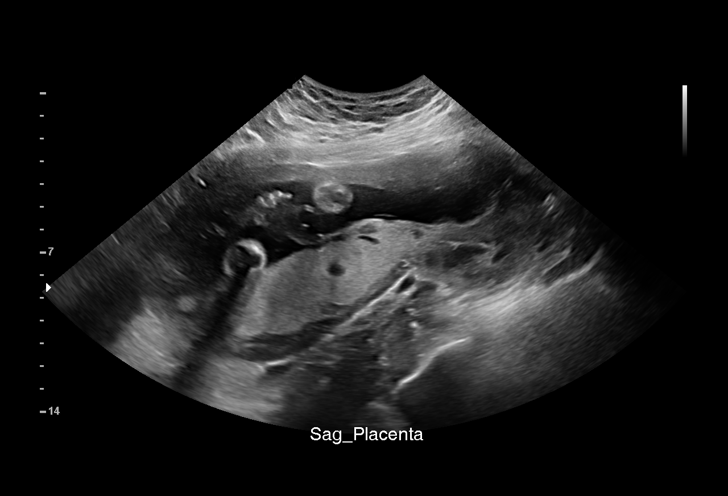
[im 14/52]
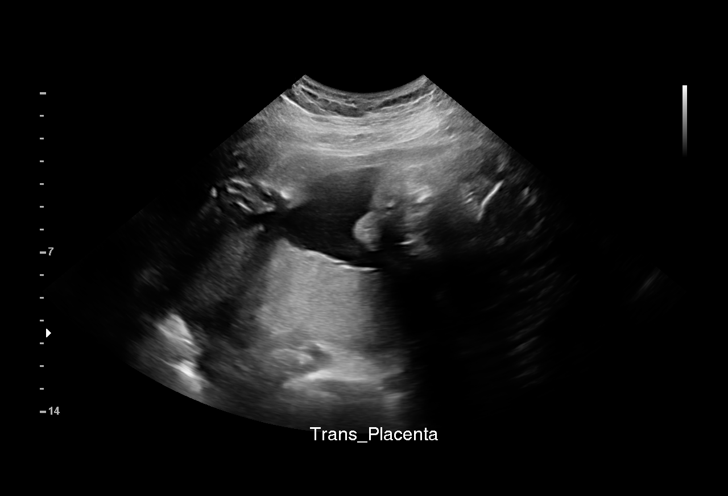
[im 18/52]
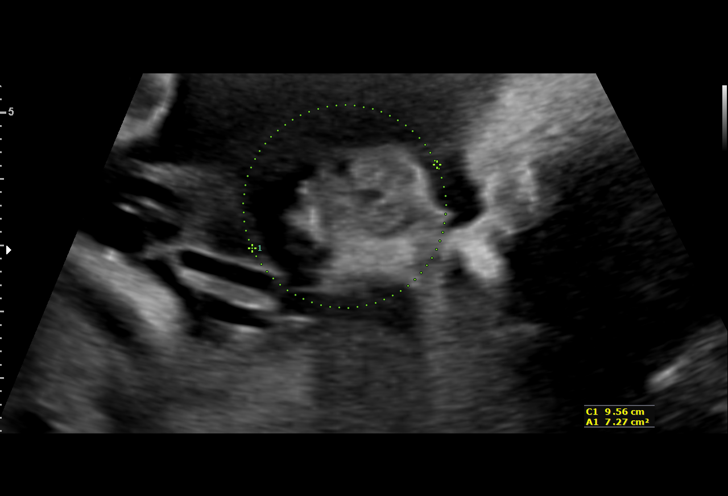
[im 21/52]
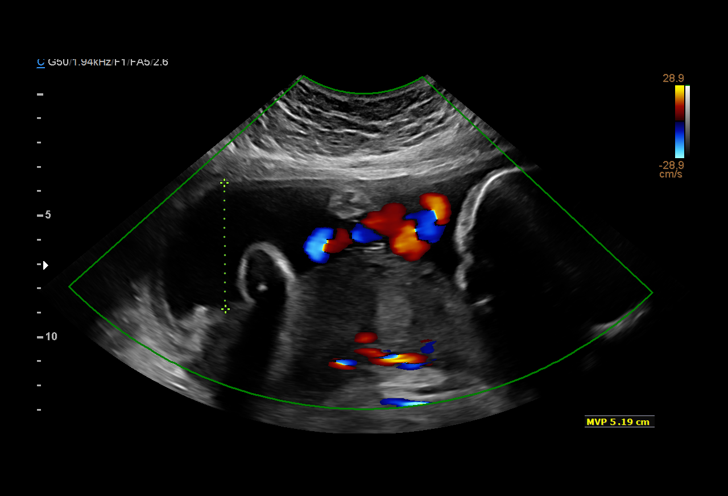
[im 25/52]
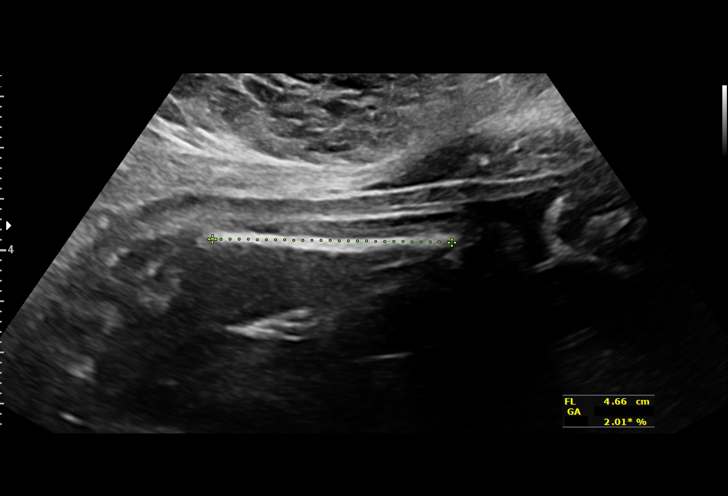
[im 29/52]
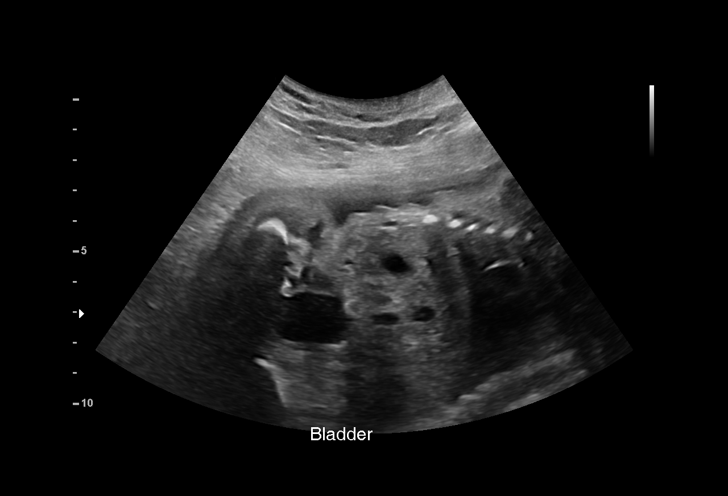
[im 33/52]
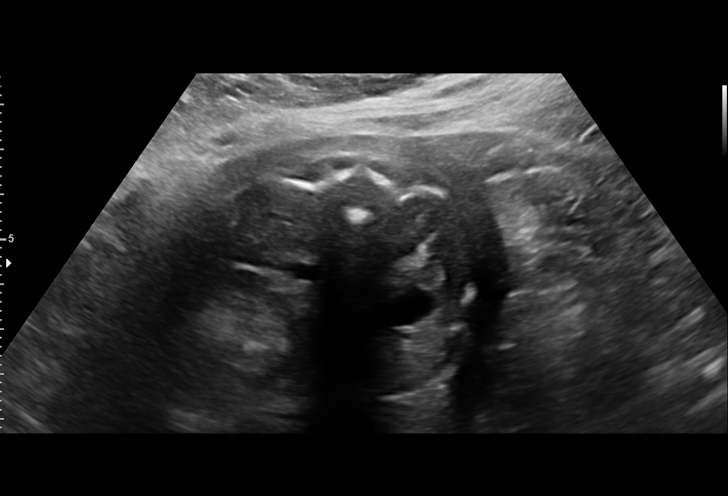
[im 36/52]
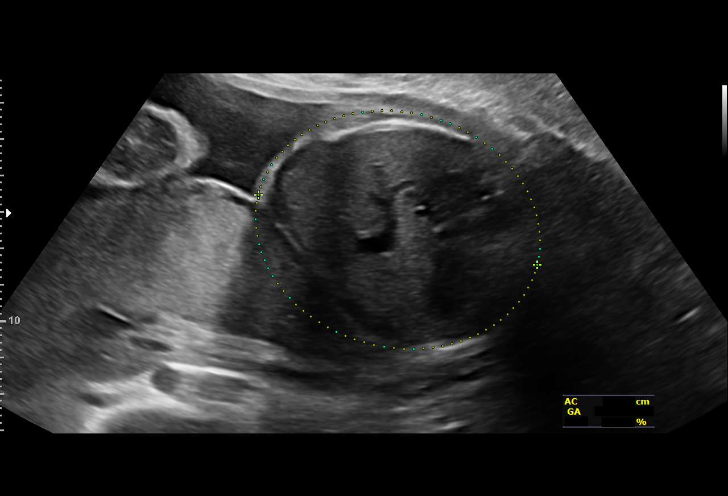
[im 40/52]
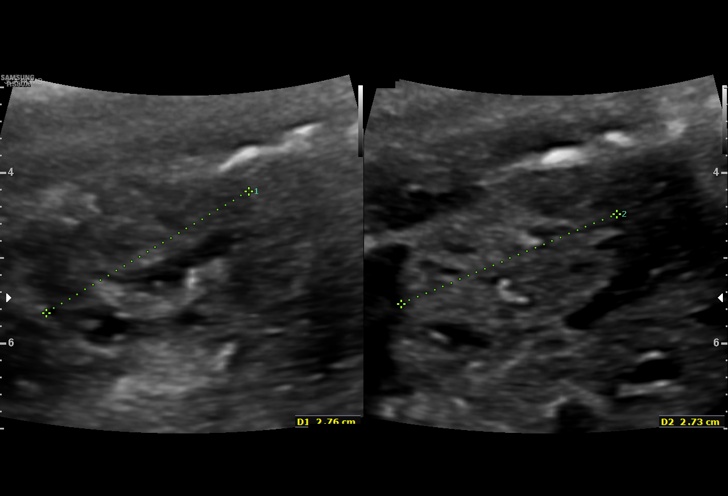
[im 44/52]
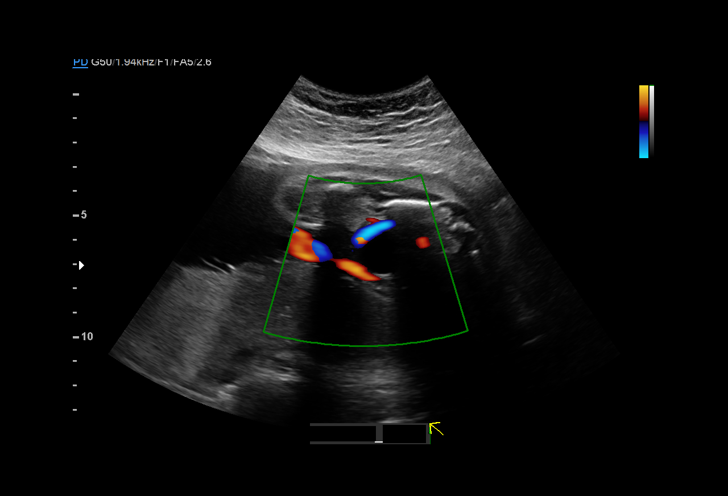
[im 48/52]
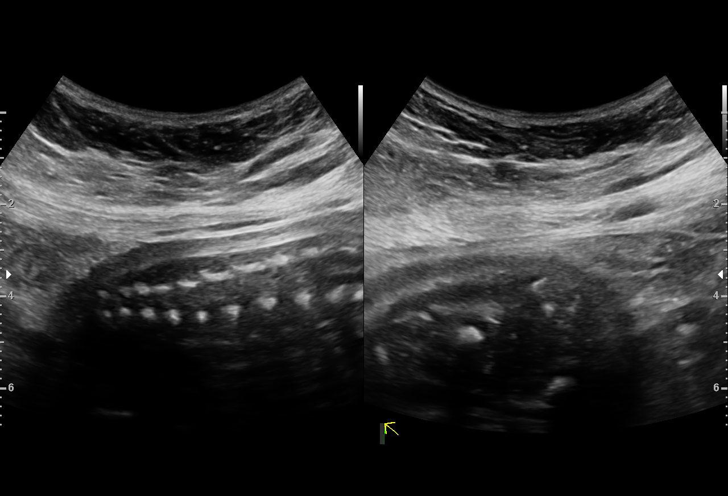
[im 52/52]
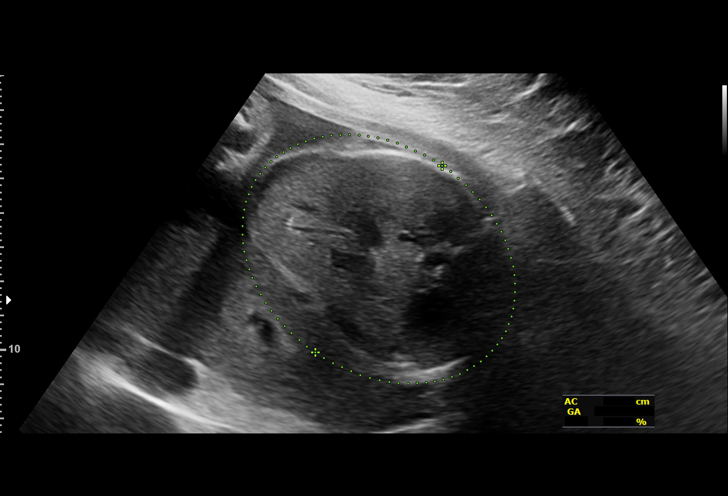

[14 of 28 positions shown; findings below may reference images not displayed]

[address]

                                                       [place]
 ----------------------------------------------------------------------

 ----------------------------------------------------------------------
Indications

  Encounter for other antenatal screening
  follow-up
  27 weeks gestation of pregnancy
 ----------------------------------------------------------------------
Vital Signs

 BMI:
Fetal Evaluation

 Num Of Fetuses:          1
 Fetal Heart Rate(bpm):   148
 Cardiac Activity:        Observed
 Presentation:            Breech
 Placenta:                Posterior
 P. Cord Insertion:       Visualized, central

 Amniotic Fluid
 AFI FV:      Within normal limits

                             Largest Pocket(cm)

Biometry

 BPD:      69.1  mm     G. Age:  27w 5d         45  %    CI:        70.95   %    70 - 86
                                                         FL/HC:       19.1  %    18.8 -
 HC:      261.4  mm     G. Age:  28w 3d         48  %    HC/AC:       1.16       1.05 -
 AC:      226.1  mm     G. Age:  27w 0d         25  %    FL/BPD:      72.1  %    71 - 87
 FL:       49.8  mm     G. Age:  26w 6d         17  %    FL/AC:       22.0  %    20 - 24
 HUM:      46.1  mm     G. Age:  27w 1d         38  %

 Est. FW:    5121   gm     2 lb 4 oz     42  %
OB History

 Gravidity:    1         Term:   0        Prem:   0        SAB:   0
 TOP:          0       Ectopic:  0        Living: 0
Gestational Age

 LMP:           27w 0d        Date:  01/16/18                 EDD:   10/23/18
 U/S Today:     27w 4d                                        EDD:   10/19/18
 Best:          27w 4d     Det. By:  U/S  (05/29/18)          EDD:   10/19/18
Anatomy

 Cranium:               Appears normal         Aortic Arch:            Not well visualized
 Cavum:                 Appears normal         Ductal Arch:            Not well visualized
 Ventricles:            Appears normal         Diaphragm:              Appears normal
 Choroid Plexus:        Previously seen        Stomach:                Appears normal, left
                                                                       sided
 Cerebellum:            Previously seen        Abdomen:                Appears normal
 Posterior Fossa:       Previously seen        Abdominal Wall:         Not well visualized
 Nuchal Fold:           Not applicable (>20    Cord Vessels:           Appears normal (3
                        wks GA)                                        vessel cord)
 Face:                  Orbits and profile     Kidneys:                Appear normal
                        previously seen
 Lips:                  Appears normal         Bladder:                Appears normal
 Thoracic:              Appears normal         Spine:                  Ltd views no
                                                                       intracranial signs of
                                                                       NTD
 Heart:                 Previously seen        Upper Extremities:      Previously seen
 RVOT:                  Previously seen        Lower Extremities:      Previously seen
 LVOT:                  Previously seen

 Other:  Heels visualized. Fetus appears to be a male. Technically difficult due
         to maternal habitus and fetal position.
Cervix Uterus Adnexa

 Cervix
 Not visualized (advanced GA >37wks)
Impression

 Normal interval growth.
Recommendations

 Follow up as clinically indicated.

## 2021-01-02 ENCOUNTER — Other Ambulatory Visit: Payer: Self-pay

## 2021-01-02 ENCOUNTER — Encounter (HOSPITAL_COMMUNITY): Payer: Self-pay | Admitting: *Deleted

## 2021-01-02 ENCOUNTER — Ambulatory Visit (HOSPITAL_COMMUNITY)
Admission: EM | Admit: 2021-01-02 | Discharge: 2021-01-02 | Disposition: A | Discharge status: Home / Self Care | Payer: Medicaid Other | Attending: Physician Assistant | Admitting: Physician Assistant

## 2021-01-02 DIAGNOSIS — L729 Follicular cyst of the skin and subcutaneous tissue, unspecified: Secondary | ICD-10-CM | POA: Diagnosis not present

## 2021-01-02 DIAGNOSIS — L089 Local infection of the skin and subcutaneous tissue, unspecified: Secondary | ICD-10-CM | POA: Diagnosis not present

## 2021-01-02 MED ORDER — CEPHALEXIN 500 MG PO CAPS: 500.0000 mg | ORAL_CAPSULE | Freq: Three times a day (TID) | ORAL | 0 refills | Status: AC

## 2021-01-02 NOTE — ED Provider Notes (Signed)
[facility]    CSN: [id number] Arrival date & time: 01/02/21  1731      History   Chief Complaint Chief Complaint  Patient presents with   Skin Problem    HPI Kylie Wallace is a 20 y.o. female.   Patient presents today with a 1 week history of increasing the painful abscess.  She has a history of cyst on her chest wall but has been evaluated by other providers but no intervention was taken.  Over the past several days it is increased in size and become painful.  Pain is rated 6 on a 0-10 pain scale, localized to affected area, described as aching, no aggravating relieving factors identified.  She has not tried any over-the-counter medications.  She denies history of MRSA or recurrent skin infections.  Denies any recent antibiotic use.  She denies any systemic symptoms including fever, nausea, vomiting, body aches   Past Medical History:  Diagnosis Date   Medical history non-contributory     There are no problems to display for this patient.   Past Surgical History:  Procedure Laterality Date   WISDOM TOOTH EXTRACTION      OB History     Gravida  1   Para  1   Term  1   Preterm  0   AB  0   Living  1      SAB  0   IAB  0   Ectopic  0   Multiple  0   Live Births  1            Home Medications    Prior to Admission medications   Medication Sig Start Date End Date Taking? Authorizing Provider  cephALEXin (KEFLEX) 500 MG capsule Take 1 capsule (500 mg total) by mouth 3 (three) times daily for 5 days. 01/02/21 01/07/21 Yes Orli Degrave K, PA-C  ibuprofen (ADVIL) 800 MG tablet Take 1 tablet (800 mg total) by mouth 3 (three) times daily. 10/15/19   Georgetta Haber, NP  naproxen (NAPROSYN) 375 MG tablet Take 1 tablet (375 mg total) by mouth 2 (two) times daily with a meal. 07/10/20   Wallis Bamberg, PA-C  ondansetron (ZOFRAN ODT) 4 MG disintegrating tablet Take 1 tablet (4 mg total) by mouth every 8 (eight) hours as needed for nausea or  vomiting. 05/04/20   Particia Nearing, PA-C  norethindrone (MICRONOR) 0.35 MG tablet Take 1 tablet (0.35 mg total) by mouth daily. 11/10/18 01/04/19  Dorathy Kinsman, CNM    Family History Family History  Problem Relation Age of Onset   Hypertension Mother    Healthy Father     Social History Social History   Tobacco Use   Smoking status: Never   Smokeless tobacco: Never  Vaping Use   Vaping Use: Never used  Substance Use Topics   Alcohol use: No   Drug use: No     Allergies   Patient has no known allergies.   Review of Systems Review of Systems  Constitutional:  Positive for activity change. Negative for appetite change, fatigue and fever.  Respiratory:  Negative for cough and shortness of breath.   Cardiovascular:  Negative for chest pain.  Gastrointestinal:  Negative for abdominal pain, diarrhea, nausea and vomiting.  Skin:  Positive for color change. Negative for wound.  Neurological:  Negative for dizziness, light-headedness and headaches.    Physical Exam Triage Vital Signs ED Triage Vitals  Enc Vitals Group     BP 01/02/21  1852 105/71     Pulse Rate 01/02/21 1852 77     Resp 01/02/21 1852 18     Temp 01/02/21 1852 98.9 F (37.2 C)     Temp src --      SpO2 01/02/21 1852 99 %     Weight --      Height --      Head Circumference --      Peak Flow --      Pain Score 01/02/21 1850 6     Pain Loc --      Pain Edu? --      Excl. in GC? --    No data found.  Updated Vital Signs BP 105/71   Pulse 77   Temp 98.9 F (37.2 C)   Resp 18   LMP 12/19/2020 (Approximate)   SpO2 99%   Visual Acuity Right Eye Distance:   Left Eye Distance:   Bilateral Distance:    Right Eye Near:   Left Eye Near:    Bilateral Near:     Physical Exam Vitals reviewed.  Constitutional:      General: She is awake. She is not in acute distress.    Appearance: Normal appearance. She is well-developed. She is not ill-appearing.     Comments: Very pleasant female  appears at age in no acute distress sitting comfortably in exam room  HENT:     Head: Normocephalic and atraumatic.  Cardiovascular:     Rate and Rhythm: Normal rate and regular rhythm.     Heart sounds: Normal heart sounds, S1 normal and S2 normal. No murmur heard. Pulmonary:     Effort: Pulmonary effort is normal.     Breath sounds: Normal breath sounds. No wheezing, rhonchi or rales.     Comments: Clear to auscultation bilaterally Abdominal:     General: Bowel sounds are normal.     Palpations: Abdomen is soft.     Tenderness: There is no abdominal tenderness. There is no right CVA tenderness, left CVA tenderness, guarding or rebound.     Comments: Benign abdominal exam  Skin:         Comments: 3 cm x 2 cm abscess with central fluctuance noted anterior chest wall.  Overlying erythema without streaking or evidence of lymphangitis.  Psychiatric:        Behavior: Behavior is cooperative.     UC Treatments / Results  Labs (all labs ordered are listed, but only abnormal results are displayed) Labs Reviewed - No data to display  EKG   Radiology No results found.  Procedures Incision and Drainage  Date/Time: 01/02/2021 7:59 PM Performed by: Jeani Hawking, PA-C Authorized by: Jeani Hawking, PA-C   Consent:    Consent obtained:  Verbal   Consent given by:  Patient   Risks, benefits, and alternatives were discussed: yes     Risks discussed:  Bleeding, incomplete drainage, pain and infection   Alternatives discussed:  No treatment, alternative treatment and referral Universal protocol:    Procedure explained and questions answered to patient or proxy's satisfaction: yes     Patient identity confirmed:  Verbally with patient Location:    Type:  Abscess   Size:  3 cm x 2 cm   Location:  Trunk   Trunk location:  Chest Pre-procedure details:    Skin preparation:  Chlorhexidine Sedation:    Sedation type:  None Anesthesia:    Anesthesia method:  Local infiltration    Local anesthetic:  Lidocaine  1% WITH epi Procedure type:    Complexity:  Complex Procedure details:    Ultrasound guidance: no     Needle aspiration: no     Incision types:  Single straight   Incision depth:  Dermal   Wound management:  Probed and deloculated and irrigated with saline   Drainage:  Bloody and purulent   Drainage amount:  Moderate   Wound treatment:  Wound left open   Packing materials:  None Post-procedure details:    Procedure completion:  Tolerated well, no immediate complications (including critical care time)  Medications Ordered in UC Medications - No data to display  Initial Impression / Assessment and Plan / UC Course  I have reviewed the triage vital signs and the nursing notes.  Pertinent labs & imaging results that were available during my care of the patient were reviewed by me and considered in my medical decision making (see chart for details).     I&D performed in clinic today.  See procedure note above.  Patient was encouraged to keep area clean and dry.  Discussed that symptoms may recur and if she has recurrent issues she may need to see a surgeon to have cyst removed.  Recommended alternate Tylenol ibuprofen for pain.  She was prescribed 5 days of Keflex.  Discussed signs or symptoms of worsening infection that warranted emergent evaluation.  Strict return precautions given to which she expressed understanding.  Final Clinical Impressions(s) / UC Diagnoses   Final diagnoses:  Infected cyst of skin     Discharge Instructions      We drained this cyst today.  Please keep area clean and dry.  Take antibiotics for 5 days as we discussed.  If you have any signs of infection including redness, increased drainage, fever, nausea, vomiting you need to be reevaluated.  If this continues to recur you may need to think about seeing a surgeon to have cyst removed as we discussed.     ED Prescriptions     Medication Sig Dispense Auth. Provider    cephALEXin (KEFLEX) 500 MG capsule Take 1 capsule (500 mg total) by mouth 3 (three) times daily for 5 days. 15 capsule Jaeger Trueheart K, PA-C      PDMP not reviewed this encounter.   Jeani Hawking, PA-C 01/02/21 2001

## 2021-01-02 NOTE — ED Triage Notes (Signed)
Pt reports a skin abscess on Upper chest wall for as long as she can remember. Pt reported her PCP has seen site but did not do anything for it.

## 2021-01-02 NOTE — Discharge Instructions (Addendum)
We drained this cyst today.  Please keep area clean and dry.  Take antibiotics for 5 days as we discussed.  If you have any signs of infection including redness, increased drainage, fever, nausea, vomiting you need to be reevaluated.  If this continues to recur you may need to think about seeing a surgeon to have cyst removed as we discussed.

## 2021-03-18 NOTE — L&D Delivery Note (Addendum)
Delivery Note Kylie Wallace is a 21 y.o. G2P1001 at [redacted]w[redacted]d admitted for IOL d/t oligohydramnios.   GBS Status: Negative/-- (08/17 1109) Maximum Maternal Temperature: 99.9  Labor course: Initial SVE: 3.5/20/-3. Augmentation with: AROM, Pitocin, and Cytotec. She then progressed to complete.  ROM: 6h 58m with clear fluid  Birth: At 1743 a viable female was delivered via spontaneous vaginal delivery (Presentation: ROA) by Drucie Opitz, SNM. Nuchal cord present: Yes, loose, reduced.  Shoulders and body delivered in usual fashion. Infant placed directly on mom's abdomen for bonding/skin-to-skin, baby dried and stimulated. Cord clamped x 2 after 1 minute and cut by FOB.  Cord blood collected.  Large gush of blood just prior to the placenta separating spontaneously and then delivered via gentle cord traction w/ trailing membranes- twisted w/ ring forceps to remove by me.  Pitocin infused rapidly IV per protocol. Large gush, TXA and cytotec buccal/412mcg rectal given, followed by methergine. Fundus firms, then boggy again, LUS cleared of clots. Dr. Macon Large in, fundus firm briefly, then boggy, full uterine sweep w/ removal of few more clots, then fundus remained firm. Placenta inspected and appears to be intact with a 3 VC.  Placenta/Cord with the following complications: none .  Cord pH: not done Sponge and instrument count were correct x2. Will give Ancef 2gm IV x 1 for uterine sweep, discussed w/ pharmacy d/t being on amoxicillin for otitis, ok to do both.  Intrapartum complications:  otitis media w/ pain and low-grade temp Anesthesia:  none Episiotomy: none Lacerations:  none Suture Repair:  n/a QBL (mL): 1101   Infant: APGAR (1 MIN):  see delivery summary- vigorous infant APGAR (5 MINS):   APGAR (10 MINS):    Infant weight: pending  Mom to postpartum.  Baby to Couplet care / Skin to Skin. Placenta to L&D   Plans to Breast and bottlefeed Contraception: oral progesterone-only  contraceptive Circumcision: N/A  Note sent to [facility]: [facility] for pp visit.  Cheral Marker CNM, [facility] 11/19/2021 6:10 PM

## 2021-04-03 ENCOUNTER — Other Ambulatory Visit: Payer: Self-pay

## 2021-04-03 ENCOUNTER — Ambulatory Visit (INDEPENDENT_AMBULATORY_CARE_PROVIDER_SITE_OTHER): Payer: Medicaid Other

## 2021-04-03 DIAGNOSIS — Z3200 Encounter for pregnancy test, result unknown: Secondary | ICD-10-CM

## 2021-04-03 DIAGNOSIS — Z3201 Encounter for pregnancy test, result positive: Secondary | ICD-10-CM

## 2021-04-03 LAB — POCT PREGNANCY, URINE: Preg Test, Ur: POSITIVE — AB

## 2021-04-03 NOTE — Progress Notes (Signed)
Possible Pregnancy  Here today for pregnancy confirmation. UPT in office today is positive. Pt reports first positive home UPT on 04/02/21. Reviewed dating with patient:   LMP: 02/18/21 EDD: 11/25/21 6w 2d today  OB history reviewed; single vaginal delivery. Reviewed medications and allergies with patient. Recommended pt continue prenatal vitamin and schedule prenatal care. Desires to come to [facility] for prenatal care; message sent to front office to schedule.   Marjo Bicker, RN 04/03/2021  4:17 PM

## 2021-04-04 ENCOUNTER — Telehealth: Payer: Medicaid Other

## 2021-04-04 NOTE — Progress Notes (Signed)
I reviewed the patient history and course with the nurse. I agree with the documentation and plan as noted.  ? ?Nelvin Tomb, MD ? ?

## 2021-04-25 ENCOUNTER — Telehealth (INDEPENDENT_AMBULATORY_CARE_PROVIDER_SITE_OTHER): Payer: Medicaid Other

## 2021-04-25 DIAGNOSIS — Z3A00 Weeks of gestation of pregnancy not specified: Secondary | ICD-10-CM

## 2021-04-25 DIAGNOSIS — Z3480 Encounter for supervision of other normal pregnancy, unspecified trimester: Secondary | ICD-10-CM | POA: Insufficient documentation

## 2021-04-25 MED ORDER — BLOOD PRESSURE MONITORING DEVI: 1.0000 | 0 refills | Status: DC

## 2021-04-25 MED ORDER — GOJJI WEIGHT SCALE MISC: 1.0000 | 0 refills | Status: DC

## 2021-04-25 NOTE — Progress Notes (Signed)
New OB Intake  I connected with  Kylie Wallace on 04/25/21 at  1:15 PM EST by MyChart Video Visit and verified that I am speaking with the correct person using two identifiers. Nurse is located at [facility] and pt is located at home.  I discussed the limitations, risks, security and privacy concerns of performing an evaluation and management service by telephone and the availability of in person appointments. I also discussed with the patient that there may be a patient responsible charge related to this service. The patient expressed understanding and agreed to proceed.  I explained I am completing New OB Intake today. We discussed her EDD of 11/25/21 that is based on LMP of 02/18/21. Pt is G2/P1. I reviewed her allergies, medications, Medical/Surgical/OB history, and appropriate screenings. I informed her of [facility] services. Based on history, this is a/an  pregnancy uncomplicated .   There are no problems to display for this patient.   Concerns addressed today  Delivery Plans:  Plans to deliver at [facility].   MyChart/Babyscripts MyChart access verified. I explained pt will have some visits in office and some virtually. Babyscripts instructions given and order placed. Patient verifies receipt of registration text/e-mail. Account successfully created and app downloaded.  Blood Pressure Cuff  Blood pressure cuff ordered for patient to pick-up from [employer]. Explained after first prenatal appt pt will check weekly and document in Babyscripts.  Weight scale: Patient does / does not  have weight scale. Weight scale ordered for patient to pick up from [employer].   Anatomy US Explained first scheduled [place] will be around 19 weeks. Anatomy US scheduled for 07/02/21 at 09:00A. Pt notified to arrive at 08:45A. Scheduled AFP lab only appointment if CenteringPregnancy pt for same day as anatomy US.   Labs Discussed Avelina Laine genetic screening with patient. Would like both Panorama and Horizon  drawn at new OB visit.Also if interested in genetic testing, tell patient she will need AFP 15-21 weeks to complete genetic testing .Routine prenatal labs needed.  Covid Vaccine Patient has not covid vaccine.   CenteringPregnancy Candidate? Declined due to child care. If yes, offer as possibility  Mother/ Baby Dyad Candidate?    If yes, offer as possibility  Informed patient of [facility] website  and placed link in her AVS.   Social Determinants of Health Food Insecurity: Patient denies food insecurity. WIC Referral: Patient is interested in referral to [facility].  Transportation: Patient denies transportation needs. Childcare: Discussed no children allowed at ultrasound appointments. Offered childcare services; patient declines childcare services at this time.  Send link to Pregnancy Navigators   Placed OB Box on problem list and updated  First visit review I reviewed new OB appt with pt. I explained she will have a pelvic exam, ob bloodwork with genetic screening, and PAP smear. Explained pt will be seen by Edd Arbour, CNM at first visit; encounter routed to appropriate provider. Explained that patient will be seen by pregnancy navigator following visit with provider. [facility] information placed in AVS.   Henrietta Dine, CMA 04/25/2021  1:11 PM

## 2021-04-25 NOTE — Patient Instructions (Signed)
  At our [facility], we work as an integrated team, providing care to address both physical and emotional health. Your medical provider may refer you to see our Behavioral Health Clinician (BHC) on the same day you see your medical provider, as availability permits; often scheduled virtually at your convenience.  Our BHC is available to all patients, visits generally last between 20-30 minutes, but can be longer or shorter, depending on patient need. The BHC offers help with stress management, coping with symptoms of depression and anxiety, major life changes , sleep issues, changing risky behavior, grief and loss, life stress, working on personal life goals, and  behavioral health issues, as these all affect your overall health and wellness.  The BHC is NOT available for the following: FMLA paperwork, court-ordered evaluations, specialty assessments (custody or disability), letters to employers, or obtaining certification for an emotional support animal. The BHC does not provide long-term therapy. You have the right to refuse integrated behavioral health services, or to reschedule to see the BHC at a later date.  Confidentiality exception: If it is suspected that a child or disabled adult is being abused or neglected, we are required by law to report that to either Child Protective Services or Adult Protective Services.  If you have a diagnosis of Bipolar affective disorder, Schizophrenia, or recurrent Major depressive disorder, we will recommend that you establish care with a psychiatrist, as these are lifelong, chronic conditions, and we want your overall emotional health and medications to be more closely monitored. If you anticipate needing extended maternity leave due to mental health issues postpartum, it it recommended you inform your medical provider, so we can put in a referral to a psychiatrist as soon as possible. The BHC is unable to recommend an extended maternity leave for mental  health issues. Your medical provider or BHC may refer you to a therapist for ongoing, traditional therapy, or to a psychiatrist, for medication management, if it would benefit your overall health. Depending on your insurance, you may have a copay or be charged a deductible, depending on your insurance, to see the BHC. If you are uninsured, it is recommended that you apply for financial assistance. (Forms may be requested at the front desk for in-person visits, via MyChart, or request a form during a virtual visit).  If you see the BHC more than 6 times, you will have to complete a comprehensive clinical assessment interview with the BHC to resume integrated services.  For virtual visits with the BHC, you must be physically in the state of [place] at the time of the visit. For example, if you live in [place], you will have to do an in-person visit with the BHC, and your out-of-state insurance may not cover behavioral health services in [place]. If you are going out of the state or country for any reason, the BHC may see you virtually when you return to [place], but not while you are physically outside of [place].    

## 2021-05-09 ENCOUNTER — Other Ambulatory Visit: Payer: Self-pay

## 2021-05-09 ENCOUNTER — Ambulatory Visit (INDEPENDENT_AMBULATORY_CARE_PROVIDER_SITE_OTHER): Payer: Medicaid Other | Admitting: Certified Nurse Midwife

## 2021-05-09 ENCOUNTER — Other Ambulatory Visit (HOSPITAL_COMMUNITY)
Admission: RE | Admit: 2021-05-09 | Discharge: 2021-05-09 | Disposition: A | Discharge status: Home / Self Care | Payer: Medicaid Other | Source: Ambulatory Visit | Attending: Certified Nurse Midwife | Admitting: Certified Nurse Midwife

## 2021-05-09 VITALS — BP 126/81 | HR 77 | Wt 206.2 lb

## 2021-05-09 DIAGNOSIS — Z3A11 11 weeks gestation of pregnancy: Secondary | ICD-10-CM

## 2021-05-09 DIAGNOSIS — Z3491 Encounter for supervision of normal pregnancy, unspecified, first trimester: Secondary | ICD-10-CM | POA: Insufficient documentation

## 2021-05-09 DIAGNOSIS — O219 Vomiting of pregnancy, unspecified: Secondary | ICD-10-CM

## 2021-05-09 MED ORDER — PROMETHAZINE HCL 12.5 MG PO TABS
12.5000 mg | ORAL_TABLET | Freq: Four times a day (QID) | ORAL | 1 refills | Status: DC | PRN
Start: 2021-05-09 — End: 2021-07-31

## 2021-05-09 MED ORDER — BONJESTA 20-20 MG PO TBCR: 1.0000 | EXTENDED_RELEASE_TABLET | Freq: Every day | ORAL | 3 refills | Status: DC

## 2021-05-09 NOTE — Progress Notes (Signed)
History:   Kylie Wallace is a 21 y.o. G2P1001 at 75w3dby LMP being seen today for her first obstetrical visit.  Her obstetrical history is significant for obesity. Patient does intend to breast feed. Pregnancy history fully reviewed.  Patient reports nausea and vomiting, but otherwise doing well. Appropriately anxious when unable to hear FHR, but relieved after bedside ultrasound.   HISTORY: OB History  Gravida Para Term Preterm AB Living  '2 1 1 ' 0 0 1  SAB IAB Ectopic Multiple Live Births  0 0 0 0 1    # Outcome Date GA Lbr Len/2nd Weight Sex Delivery Anes PTL Lv  2 Current           1 Term 10/10/18 [id number]:34 / 00:10 6 lb 11.4 oz (3.045 kg) M Vag-Spont Local  LIV     Birth Comments: moulding     Name: Schimpf,BOY Reeva     Apgar1: 8  Apgar5: 9    No history of pap smear, was not yet 21 at end of last pregnancy. Will collect today.  Past Medical History:  Diagnosis Date   Medical history non-contributory    Past Surgical History:  Procedure Laterality Date   WISDOM TOOTH EXTRACTION     Family History  Problem Relation Age of Onset   Hypertension Mother    Healthy Father    Social History   Tobacco Use   Smoking status: Never   Smokeless tobacco: Never  Vaping Use   Vaping Use: Never used  Substance Use Topics   Alcohol use: No   Drug use: No   No Known Allergies Current Outpatient Medications on File Prior to Visit  Medication Sig Dispense Refill   Prenatal Vit-Fe Fumarate-FA (PRENATAL PO) Take by mouth.     Blood Pressure Monitoring DEVI 1 each by Does not apply route once a week. 1 each 0   Misc. Devices (GOJJI WEIGHT SCALE) MISC 1 each by Does not apply route once a week. (Patient not taking: Reported on 05/09/2021) 1 each 0   [DISCONTINUED] norethindrone (MICRONOR) 0.35 MG tablet Take 1 tablet (0.35 mg total) by mouth daily. 1 Package 12   No current facility-administered medications on file prior to visit.   Review of Systems Pertinent items noted  in HPI and remainder of comprehensive ROS otherwise negative. Physical Exam:   Vitals:   05/09/21 1004 05/09/21 1100  BP: (!) 132/102 126/81  Pulse: (!) 104 77  Weight: 206 lb 3.2 oz (93.5 kg)    Bedside Ultrasound for FHR check: Unable to definitively see FHR with mobile probe, sent to RN for bedside ultrasound.  Patient informed that the ultrasound is considered a limited obstetric ultrasound and is not intended to be a complete ultrasound exam.  Patient also informed that the ultrasound is not being completed with the intent of assessing for fetal or placental anomalies or any pelvic abnormalities.  Explained that the purpose of todays ultrasound is to assess for fetal heart rate.  Patient acknowledges the purpose of the exam and the limitations of the study.  Constitutional: Well-developed, well-nourished pregnant female in no acute distress.  HEENT: PERRLA Skin: normal color and turgor, no rash Cardiovascular: normal rate & rhythm Respiratory: normal effort GI: Abd soft, non-tender, gravid appropriate for gestational age MS: Extremities nontender, no edema, normal ROM Neurologic: Alert and oriented x 4.  GU: no CVA tenderness Pelvic: NEFG, physiologic discharge, no blood, cervix clean. Pap/swabs collected FHR: 182  Assessment & Plan:  1. Supervision  of low-risk pregnancy, first trimester - Doing well other than nausea/vomiting. - CBC/D/Plt+RPR+Rh+ABO+RubIgG... - Hemoglobin A1c - Cytology - PAP( [place]) - GC/Chlamydia probe amp ([place])not at [facility] - Genetic Screening - Culture, OB Urine - Protein / creatinine ratio, urine - Comp Met (CMET)  2. [redacted] weeks gestation of pregnancy - Routine OB care   3. Nausea/vomiting in pregnancy - Doxylamine-Pyridoxine ER (BONJESTA) 20-20 MG TBCR; Take 1 tablet by mouth at bedtime.  Dispense: 60 tablet; Refill: 3 - promethazine (PHENERGAN) 12.5 MG tablet; Take 1 tablet (12.5 mg total) by mouth every 6 (six) hours as needed for  nausea or vomiting. Use for breakthrough nausea if first med is not working.  Dispense: 30 tablet; Refill: 1  4. Initial obstetric visit in first trimester - Initial labs drawn. - Continue prenatal vitamins. - Problem list reviewed and updated. - Genetic Screening discussed, First trimester screen, Quad screen, and NIPS: ordered. - Ultrasound discussed; fetal anatomic survey: ordered. - Anticipatory guidance about prenatal visits given including labs, ultrasounds, and testing. - Discussed usage of Babyscripts and virtual visits as additional source of managing and completing prenatal visits in midst of coronavirus and pandemic.   - Encouraged to complete MyChart Registration for her ability to review results, send requests, and have questions addressed.  - The nature of [place] for [facility] Healthcare/Faculty Practice with multiple MDs and Advanced Practice Providers was explained to patient; also emphasized that residents, students are part of our team. - Routine obstetric precautions reviewed. Encouraged to seek out care at office or emergency room [facility] preferred) for urgent and/or emergent concerns. Return in about 4 weeks (around 06/06/2021) for LOB.     Gaylan Gerold, MSN, CNM, [place] Certified Nurse Midwife, [facility]

## 2021-05-09 NOTE — Progress Notes (Signed)
Patient reports of filling as though she has to urinate but when she tries, nothing comes out. This started 2 weeks ago.  Patient also complain of vomiting  Flu vaccine was offered and patient declined

## 2021-05-09 NOTE — Progress Notes (Signed)
Informal bedside ultrasound performed to evaluate FHR, FHR 182bpm.

## 2021-05-10 LAB — CBC/D/PLT+RPR+RH+ABO+RUBIGG...
Antibody Screen: NEGATIVE
Basophils Absolute: 0 10*3/uL (ref 0.0–0.2)
Basos: 0 %
EOS (ABSOLUTE): 0 10*3/uL (ref 0.0–0.4)
Eos: 0 %
HCV Ab: NONREACTIVE
HIV Screen 4th Generation wRfx: NONREACTIVE
Hematocrit: 41.7 % (ref 34.0–46.6)
Hemoglobin: 13.5 g/dL (ref 11.1–15.9)
Hepatitis B Surface Ag: NEGATIVE
Immature Grans (Abs): 0 10*3/uL (ref 0.0–0.1)
Immature Granulocytes: 0 %
Lymphocytes Absolute: 1.8 10*3/uL (ref 0.7–3.1)
Lymphs: 18 %
MCH: 27.9 pg (ref 26.6–33.0)
MCHC: 32.4 g/dL (ref 31.5–35.7)
MCV: 86 fL (ref 79–97)
Monocytes Absolute: 0.4 10*3/uL (ref 0.1–0.9)
Monocytes: 4 %
Neutrophils Absolute: 7.4 10*3/uL — ABNORMAL HIGH (ref 1.4–7.0)
Neutrophils: 78 %
Platelets: 222 10*3/uL (ref 150–450)
RBC: 4.84 x10E6/uL (ref 3.77–5.28)
RDW: 13.6 % (ref 11.7–15.4)
RPR Ser Ql: NONREACTIVE
Rh Factor: POSITIVE
Rubella Antibodies, IGG: 1.35 index (ref >0.99)
WBC: 9.7 10*3/uL (ref 3.4–10.8)

## 2021-05-10 LAB — PROTEIN / CREATININE RATIO, URINE
Creatinine, Urine: 341.1 mg/dL
Protein, Ur: 52.9 mg/dL
Protein/Creat Ratio: 155 mg/g creat (ref 0–200)

## 2021-05-10 LAB — HCV INTERPRETATION

## 2021-05-10 LAB — HEMOGLOBIN A1C
Est. average glucose Bld gHb Est-mCnc: 103 mg/dL
Hgb A1c MFr Bld: 5.2 % (ref 4.8–5.6)

## 2021-05-10 LAB — COMPREHENSIVE METABOLIC PANEL
ALT: 24 IU/L (ref 0–32)
AST: 20 IU/L (ref 0–40)
Albumin/Globulin Ratio: 1.7 (ref 1.2–2.2)
Albumin: 4.5 g/dL (ref 3.9–5.0)
Alkaline Phosphatase: 68 IU/L (ref 44–121)
BUN/Creatinine Ratio: 11 (ref 9–23)
BUN: 8 mg/dL (ref 6–20)
Bilirubin Total: 0.3 mg/dL (ref 0.0–1.2)
CO2: 20 mmol/L (ref 20–29)
Calcium: 9.7 mg/dL (ref 8.7–10.2)
Chloride: 100 mmol/L (ref 96–106)
Creatinine, Ser: 0.75 mg/dL (ref 0.57–1.00)
Globulin, Total: 2.7 g/dL (ref 1.5–4.5)
Glucose: 88 mg/dL (ref 70–99)
Potassium: 4.3 mmol/L (ref 3.5–5.2)
Sodium: 134 mmol/L (ref 134–144)
Total Protein: 7.2 g/dL (ref 6.0–8.5)
eGFR: 115 mL/min/{1.73_m2} (ref >59)

## 2021-05-10 LAB — GC/CHLAMYDIA PROBE AMP (~~LOC~~) NOT AT ARMC
Chlamydia: NEGATIVE
Comment: NEGATIVE
Comment: NORMAL
Neisseria Gonorrhea: NEGATIVE

## 2021-05-11 ENCOUNTER — Encounter: Payer: Self-pay | Admitting: *Deleted

## 2021-05-11 LAB — CYTOLOGY - PAP
Chlamydia: NEGATIVE
Comment: NEGATIVE
Comment: NORMAL
Diagnosis: NEGATIVE
Neisseria Gonorrhea: NEGATIVE

## 2021-05-11 LAB — CULTURE, OB URINE

## 2021-05-11 LAB — URINE CULTURE, OB REFLEX

## 2021-06-06 ENCOUNTER — Encounter: Payer: Self-pay | Admitting: Family Medicine

## 2021-06-06 ENCOUNTER — Ambulatory Visit (INDEPENDENT_AMBULATORY_CARE_PROVIDER_SITE_OTHER): Payer: Medicaid Other | Admitting: Family Medicine

## 2021-06-06 ENCOUNTER — Other Ambulatory Visit: Payer: Self-pay

## 2021-06-06 VITALS — BP 117/80 | HR 87 | Wt 208.1 lb

## 2021-06-06 DIAGNOSIS — Z3480 Encounter for supervision of other normal pregnancy, unspecified trimester: Secondary | ICD-10-CM

## 2021-06-06 NOTE — Progress Notes (Signed)
Patient in for routine prenatal visit, no issues or concerns at this time. Reports now feeling flutters.  ? ?AFP order for patient, explained test and patient agreed to have lab drawn today. ? ? ?Wynona Canes, CMA ? ?

## 2021-06-06 NOTE — Progress Notes (Signed)
? ?  Subjective:  ?Kylie Wallace is a 21 y.o. G2P1001 at [redacted]w[redacted]d being seen today for ongoing prenatal care.  She is currently monitored for the following issues for this low-risk pregnancy and has Supervision of other normal pregnancy, antepartum on their problem list. ? ?Patient reports no complaints.  Contractions: Not present. Vag. Bleeding: None.  Movement: Present. Denies leaking of fluid.  ? ?The following portions of the patient's history were reviewed and updated as appropriate: allergies, current medications, past family history, past medical history, past social history, past surgical history and problem list. Problem list updated. ? ?Objective:  ? ?Vitals:  ? 06/06/21 1013  ?BP: 117/80  ?Pulse: 87  ?Weight: 208 lb 1.6 oz (94.4 kg)  ? ? ?Fetal Status: Fetal Heart Rate (bpm): 161   Movement: Present    ? ?General:  Alert, oriented and cooperative. Patient is in no acute distress.  ?Skin: Skin is warm and dry. No rash noted.   ?Cardiovascular: Normal heart rate noted  ?Respiratory: Normal respiratory effort, no problems with respiration noted  ?Abdomen: Soft, gravid, appropriate for gestational age. Pain/Pressure: Present     ?Pelvic: Vag. Bleeding: None     ?Cervical exam deferred        ?Extremities: Normal range of motion.  Edema: None  ?Mental Status: Normal mood and affect. Normal behavior. Normal judgment and thought content.  ? ?Urinalysis:     ? ?Assessment and Plan:  ?Pregnancy: G2P1001 at [redacted]w[redacted]d ? ?1. Supervision of other normal pregnancy, antepartum ?BP and FHR normal ?AFP today ? ?Preterm labor symptoms and general obstetric precautions including but not limited to vaginal bleeding, contractions, leaking of fluid and fetal movement were reviewed in detail with the patient. ?Please refer to After Visit Summary for other counseling recommendations.  ?Return in 4 weeks (on 07/04/2021) for [facility], ob visit. ? ? ?Venora Maples, MD ? ?

## 2021-06-06 NOTE — Patient Instructions (Signed)

## 2021-06-08 LAB — AFP, SERUM, OPEN SPINA BIFIDA
AFP MoM: 1.05
AFP Value: 26.9 ng/mL
Gest. Age on Collection Date: 15.3 weeks
Maternal Age At EDD: 23.6 yr
OSBR Risk 1 IN: 10000
Test Results:: NEGATIVE
Weight: 208 [lb_av]

## 2021-07-02 ENCOUNTER — Ambulatory Visit: Payer: Medicaid Other | Admitting: *Deleted

## 2021-07-02 ENCOUNTER — Other Ambulatory Visit: Payer: Self-pay | Admitting: *Deleted

## 2021-07-02 ENCOUNTER — Encounter: Payer: Self-pay | Admitting: *Deleted

## 2021-07-02 ENCOUNTER — Ambulatory Visit: Payer: Medicaid Other | Attending: Certified Nurse Midwife

## 2021-07-02 VITALS — BP 120/74 | HR 97

## 2021-07-02 DIAGNOSIS — O99212 Obesity complicating pregnancy, second trimester: Secondary | ICD-10-CM | POA: Diagnosis not present

## 2021-07-02 DIAGNOSIS — E669 Obesity, unspecified: Secondary | ICD-10-CM | POA: Insufficient documentation

## 2021-07-02 DIAGNOSIS — Z6835 Body mass index (BMI) 35.0-35.9, adult: Secondary | ICD-10-CM

## 2021-07-02 DIAGNOSIS — Z3480 Encounter for supervision of other normal pregnancy, unspecified trimester: Secondary | ICD-10-CM

## 2021-07-02 DIAGNOSIS — Z3A19 19 weeks gestation of pregnancy: Secondary | ICD-10-CM | POA: Diagnosis not present

## 2021-07-02 DIAGNOSIS — Z363 Encounter for antenatal screening for malformations: Secondary | ICD-10-CM | POA: Diagnosis not present

## 2021-07-04 ENCOUNTER — Ambulatory Visit (INDEPENDENT_AMBULATORY_CARE_PROVIDER_SITE_OTHER): Payer: Medicaid Other | Admitting: Family Medicine

## 2021-07-04 VITALS — BP 113/79 | HR 98 | Wt 215.0 lb

## 2021-07-04 DIAGNOSIS — Z3480 Encounter for supervision of other normal pregnancy, unspecified trimester: Secondary | ICD-10-CM

## 2021-07-04 MED ORDER — FERROUS SULFATE 325 (65 FE) MG PO TBEC: 325.0000 mg | DELAYED_RELEASE_TABLET | ORAL | 2 refills | Status: DC

## 2021-07-04 NOTE — Patient Instructions (Signed)

## 2021-07-04 NOTE — Progress Notes (Signed)
? ?  Subjective:  ?Kylie Wallace is a 21 y.o. G2P1001 at [redacted]w[redacted]d being seen today for ongoing prenatal care.  She is currently monitored for the following issues for this low-risk pregnancy and has Supervision of other normal pregnancy, antepartum on their problem list. ? ?Patient reports no complaints.  Contractions: Not present. Vag. Bleeding: None.  Movement: Present. Denies leaking of fluid.  ? ?The following portions of the patient's history were reviewed and updated as appropriate: allergies, current medications, past family history, past medical history, past social history, past surgical history and problem list. Problem list updated. ? ?Objective:  ? ?Vitals:  ? 07/04/21 1645  ?BP: 113/79  ?Pulse: 98  ?Weight: 215 lb (97.5 kg)  ? ? ?Fetal Status: Fetal Heart Rate (bpm): 154   Movement: Present    ? ?General:  Alert, oriented and cooperative. Patient is in no acute distress.  ?Skin: Skin is warm and dry. No rash noted.   ?Cardiovascular: Normal heart rate noted  ?Respiratory: Normal respiratory effort, no problems with respiration noted  ?Abdomen: Soft, gravid, appropriate for gestational age. Pain/Pressure: Present     ?Pelvic: Vag. Bleeding: None     ?Cervical exam deferred        ?Extremities: Normal range of motion.  Edema: None  ?Mental Status: Normal mood and affect. Normal behavior. Normal judgment and thought content.  ? ?Urinalysis:     ? ?Assessment and Plan:  ?Pregnancy: G2P1001 at [redacted]w[redacted]d ? ?1. Supervision of other normal pregnancy, antepartum ?BP and FHR normal ?Iron pills sent per her request as prenatal gummies do not have them ?Having some round ligament pain, discussed maternity belt, conservative strategies ? ?Preterm labor symptoms and general obstetric precautions including but not limited to vaginal bleeding, contractions, leaking of fluid and fetal movement were reviewed in detail with the patient. ?Please refer to After Visit Summary for other counseling recommendations.  ?Return in 4  weeks (on 08/01/2021). ? ? ?Venora Maples, MD ? ?

## 2021-07-31 ENCOUNTER — Ambulatory Visit: Payer: Medicaid Other | Attending: Obstetrics and Gynecology

## 2021-07-31 ENCOUNTER — Encounter: Payer: Self-pay | Admitting: Family Medicine

## 2021-07-31 ENCOUNTER — Other Ambulatory Visit: Payer: Self-pay | Admitting: *Deleted

## 2021-07-31 ENCOUNTER — Encounter: Payer: Self-pay | Admitting: *Deleted

## 2021-07-31 ENCOUNTER — Ambulatory Visit: Payer: Medicaid Other | Admitting: *Deleted

## 2021-07-31 ENCOUNTER — Ambulatory Visit (INDEPENDENT_AMBULATORY_CARE_PROVIDER_SITE_OTHER): Payer: Medicaid Other | Admitting: Family Medicine

## 2021-07-31 VITALS — BP 126/62 | HR 96

## 2021-07-31 DIAGNOSIS — Z6835 Body mass index (BMI) 35.0-35.9, adult: Secondary | ICD-10-CM

## 2021-07-31 DIAGNOSIS — O99212 Obesity complicating pregnancy, second trimester: Secondary | ICD-10-CM | POA: Diagnosis not present

## 2021-07-31 DIAGNOSIS — Z362 Encounter for other antenatal screening follow-up: Secondary | ICD-10-CM

## 2021-07-31 DIAGNOSIS — E669 Obesity, unspecified: Secondary | ICD-10-CM | POA: Diagnosis not present

## 2021-07-31 DIAGNOSIS — Z3689 Encounter for other specified antenatal screening: Secondary | ICD-10-CM

## 2021-07-31 DIAGNOSIS — Z3480 Encounter for supervision of other normal pregnancy, unspecified trimester: Secondary | ICD-10-CM

## 2021-07-31 DIAGNOSIS — O4100X0 Oligohydramnios, unspecified trimester, not applicable or unspecified: Secondary | ICD-10-CM

## 2021-07-31 DIAGNOSIS — Z3A23 23 weeks gestation of pregnancy: Secondary | ICD-10-CM | POA: Diagnosis not present

## 2021-07-31 NOTE — Patient Instructions (Signed)

## 2021-07-31 NOTE — Progress Notes (Signed)
? ?  Subjective:  ?Kylie Wallace is a 21 y.o. G2P1001 at [redacted]w[redacted]d being seen today for ongoing prenatal care.  She is currently monitored for the following issues for this low-risk pregnancy and has Supervision of other normal pregnancy, antepartum on their problem list. ? ?Patient reports no complaints.  Contractions: Not present. Vag. Bleeding: None.  Movement: Present. Denies leaking of fluid.  ? ?The following portions of the patient's history were reviewed and updated as appropriate: allergies, current medications, past family history, past medical history, past social history, past surgical history and problem list. Problem list updated. ? ?Objective:  ? ?Vitals:  ? 07/31/21 0934  ?BP: 119/76  ?Pulse: 96  ?Weight: 216 lb (98 kg)  ? ? ?Fetal Status: Fetal Heart Rate (bpm): 156   Movement: Present    ? ?General:  Alert, oriented and cooperative. Patient is in no acute distress.  ?Skin: Skin is warm and dry. No rash noted.   ?Cardiovascular: Normal heart rate noted  ?Respiratory: Normal respiratory effort, no problems with respiration noted  ?Abdomen: Soft, gravid, appropriate for gestational age. Pain/Pressure: Absent     ?Pelvic: Vag. Bleeding: None     ?Cervical exam deferred        ?Extremities: Normal range of motion.     ?Mental Status: Normal mood and affect. Normal behavior. Normal judgment and thought content.  ? ?Urinalysis:     ? ?Assessment and Plan:  ?Pregnancy: G2P1001 at [redacted]w[redacted]d ? ?1. Supervision of other normal pregnancy, antepartum ?BP and FHR normal ?Discussed need for third trimester labs, needs to be fasting next visit ? ?Preterm labor symptoms and general obstetric precautions including but not limited to vaginal bleeding, contractions, leaking of fluid and fetal movement were reviewed in detail with the patient. ?Please refer to After Visit Summary for other counseling recommendations.  ?Return in 4 weeks (on 08/28/2021) for [facility], ob visit. ? ? ?Venora Maples, MD ? ?

## 2021-08-27 ENCOUNTER — Other Ambulatory Visit: Payer: Self-pay | Admitting: *Deleted

## 2021-08-27 DIAGNOSIS — Z3480 Encounter for supervision of other normal pregnancy, unspecified trimester: Secondary | ICD-10-CM

## 2021-08-31 ENCOUNTER — Ambulatory Visit: Payer: Medicaid Other | Admitting: *Deleted

## 2021-08-31 ENCOUNTER — Ambulatory Visit: Payer: Medicaid Other | Attending: Obstetrics and Gynecology

## 2021-08-31 ENCOUNTER — Encounter: Payer: Self-pay | Admitting: *Deleted

## 2021-08-31 VITALS — BP 114/63 | HR 87

## 2021-08-31 DIAGNOSIS — O4103X0 Oligohydramnios, third trimester, not applicable or unspecified: Secondary | ICD-10-CM

## 2021-08-31 DIAGNOSIS — Z3480 Encounter for supervision of other normal pregnancy, unspecified trimester: Secondary | ICD-10-CM | POA: Insufficient documentation

## 2021-08-31 DIAGNOSIS — Z3689 Encounter for other specified antenatal screening: Secondary | ICD-10-CM | POA: Insufficient documentation

## 2021-08-31 DIAGNOSIS — Z3A27 27 weeks gestation of pregnancy: Secondary | ICD-10-CM | POA: Diagnosis not present

## 2021-08-31 DIAGNOSIS — O99212 Obesity complicating pregnancy, second trimester: Secondary | ICD-10-CM | POA: Diagnosis not present

## 2021-08-31 DIAGNOSIS — E669 Obesity, unspecified: Secondary | ICD-10-CM | POA: Diagnosis not present

## 2021-08-31 DIAGNOSIS — O4100X0 Oligohydramnios, unspecified trimester, not applicable or unspecified: Secondary | ICD-10-CM | POA: Insufficient documentation

## 2021-08-31 DIAGNOSIS — Z362 Encounter for other antenatal screening follow-up: Secondary | ICD-10-CM | POA: Diagnosis present

## 2021-08-31 DIAGNOSIS — Z6835 Body mass index (BMI) 35.0-35.9, adult: Secondary | ICD-10-CM | POA: Insufficient documentation

## 2021-09-04 ENCOUNTER — Ambulatory Visit (INDEPENDENT_AMBULATORY_CARE_PROVIDER_SITE_OTHER): Payer: Medicaid Other | Admitting: Advanced Practice Midwife

## 2021-09-04 ENCOUNTER — Other Ambulatory Visit: Payer: Medicaid Other

## 2021-09-04 ENCOUNTER — Encounter: Payer: Medicaid Other | Admitting: Advanced Practice Midwife

## 2021-09-04 ENCOUNTER — Other Ambulatory Visit: Payer: Self-pay

## 2021-09-04 VITALS — BP 115/67 | HR 96 | Wt 221.0 lb

## 2021-09-04 DIAGNOSIS — Z3480 Encounter for supervision of other normal pregnancy, unspecified trimester: Secondary | ICD-10-CM

## 2021-09-04 DIAGNOSIS — O99210 Obesity complicating pregnancy, unspecified trimester: Secondary | ICD-10-CM

## 2021-09-04 DIAGNOSIS — Z3A28 28 weeks gestation of pregnancy: Secondary | ICD-10-CM

## 2021-09-04 NOTE — Progress Notes (Signed)
   PRENATAL VISIT NOTE  Subjective:  Kylie Wallace is a 21 y.o. G2P1001 at [redacted]w[redacted]d being seen today for ongoing prenatal care.  She is currently monitored for the following issues for this low-risk pregnancy and has Supervision of other normal pregnancy, antepartum on their problem list.  Patient reports no complaints.  Contractions: Not present. Vag. Bleeding: None.  Movement: Present. Denies leaking of fluid.   The following portions of the patient's history were reviewed and updated as appropriate: allergies, current medications, past family history, past medical history, past social history, past surgical history and problem list. Problem list updated.  Objective:   Vitals:   09/04/21 0847  BP: 115/67  Pulse: 96  Weight: 221 lb (100.2 kg)    Fetal Status: Fetal Heart Rate (bpm): 149   Movement: Present     General:  Alert, oriented and cooperative. Patient is in no acute distress.  Skin: Skin is warm and dry. No rash noted.   Cardiovascular: Normal heart rate noted  Respiratory: Normal respiratory effort, no problems with respiration noted  Abdomen: Soft, gravid, appropriate for gestational age.  Pain/Pressure: Absent     Pelvic: Cervical exam deferred        Extremities: Normal range of motion.  Edema: None  Mental Status: Normal mood and affect. Normal behavior. Normal judgment and thought content.   Assessment and Plan:  Pregnancy: G2P1001 at [redacted]w[redacted]d  1. Supervision of other normal pregnancy, antepartum - Routine care, counseled on daily kick counts, interventions for low kick number, indications for evaluation in MAU - TDAP declined, willing to consider next visit  2. Obesity in pregnancy - FH measurement deferred  - TWG 15 lbs - Normal HgbA1C 05/09/2021 - Already scheduled with MFM  3. [redacted] weeks gestation of pregnancy   Preterm labor symptoms and general obstetric precautions including but not limited to vaginal bleeding, contractions, leaking of fluid and fetal  movement were reviewed in detail with the patient. Please refer to After Visit Summary for other counseling recommendations.  Return in about 2 weeks (around 09/18/2021) for MD or APP.  Future Appointments  Date Time Provider Department Center  10/05/2021  3:30 PM [facility] NURSE [facility]  10/05/2021  3:45 PM [facility] US1 [facility]    Calvert Cantor, CNM

## 2021-09-05 ENCOUNTER — Other Ambulatory Visit: Payer: Self-pay | Admitting: *Deleted

## 2021-09-05 DIAGNOSIS — Z3689 Encounter for other specified antenatal screening: Secondary | ICD-10-CM

## 2021-09-05 LAB — CBC
Hematocrit: 34.6 % (ref 34.0–46.6)
Hemoglobin: 11.2 g/dL (ref 11.1–15.9)
MCH: 27.5 pg (ref 26.6–33.0)
MCHC: 32.4 g/dL (ref 31.5–35.7)
MCV: 85 fL (ref 79–97)
Platelets: 227 10*3/uL (ref 150–450)
RBC: 4.08 x10E6/uL (ref 3.77–5.28)
RDW: 14.5 % (ref 11.7–15.4)
WBC: 10.3 10*3/uL (ref 3.4–10.8)

## 2021-09-05 LAB — GLUCOSE TOLERANCE, 2 HOURS W/ 1HR
Glucose, 1 hour: 179 mg/dL (ref 70–179)
Glucose, 2 hour: 113 mg/dL (ref 70–152)
Glucose, Fasting: 83 mg/dL (ref 70–91)

## 2021-09-05 LAB — HIV ANTIBODY (ROUTINE TESTING W REFLEX): HIV Screen 4th Generation wRfx: NONREACTIVE

## 2021-09-05 LAB — RPR: RPR Ser Ql: NONREACTIVE

## 2021-09-06 ENCOUNTER — Other Ambulatory Visit: Payer: Self-pay | Admitting: Family Medicine

## 2021-09-11 ENCOUNTER — Other Ambulatory Visit: Payer: Self-pay | Admitting: Lactation Services

## 2021-09-11 MED ORDER — FERROUS SULFATE 325 (65 FE) MG PO TBEC: 325.0000 mg | DELAYED_RELEASE_TABLET | ORAL | 2 refills | Status: DC

## 2021-09-20 ENCOUNTER — Other Ambulatory Visit: Payer: Self-pay

## 2021-09-20 ENCOUNTER — Ambulatory Visit (INDEPENDENT_AMBULATORY_CARE_PROVIDER_SITE_OTHER): Payer: Medicaid Other | Admitting: Family Medicine

## 2021-09-20 VITALS — BP 122/73 | HR 99 | Wt 224.2 lb

## 2021-09-20 DIAGNOSIS — Z3A30 30 weeks gestation of pregnancy: Secondary | ICD-10-CM

## 2021-09-20 DIAGNOSIS — Z3480 Encounter for supervision of other normal pregnancy, unspecified trimester: Secondary | ICD-10-CM

## 2021-09-20 DIAGNOSIS — Z3009 Encounter for other general counseling and advice on contraception: Secondary | ICD-10-CM

## 2021-09-20 DIAGNOSIS — Z23 Encounter for immunization: Secondary | ICD-10-CM | POA: Diagnosis not present

## 2021-09-20 NOTE — Progress Notes (Signed)
  Subjective:  Kylie Wallace is a 21 y.o. G2P1001 at [redacted]w[redacted]d being seen today for ongoing prenatal care.  She is currently monitored for the following issues for this low-risk pregnancy and has Supervision of other normal pregnancy, antepartum on their problem list.  Patient reports no complaints.  Contractions: Not present. Vag. Bleeding: None.  Movement: Present. Denies leaking of fluid.   The following portions of the patient's history were reviewed and updated as appropriate: allergies, current medications, past family history, past medical history, past social history, past surgical history and problem list. Problem list updated.  Objective:   Vitals:   09/20/21 1343  BP: 122/73  Pulse: 99  Weight: 224 lb 3.2 oz (101.7 kg)    Fetal Status: Fetal Heart Rate (bpm): 156 Fundal Height: 29 cm Movement: Present     General:  Alert, oriented and cooperative. Patient is in no acute distress.  Skin: Skin is warm and dry. No rash noted.   Cardiovascular: Normal heart rate noted  Respiratory: Normal respiratory effort, no problems with respiration noted  Abdomen: Soft, gravid, appropriate for gestational age. Pain/Pressure: Present     Pelvic: Vag. Bleeding: None     Cervical exam deferred        Extremities: Normal range of motion.  Edema: Trace  Mental Status: Normal mood and affect. Normal behavior. Normal judgment and thought content.    Assessment and Plan:  Pregnancy: G2P1001 at [redacted]w[redacted]d  1. Supervision of other normal pregnancy, antepartum Doing well. No acute concerns. Fetal heart rate and fundal height appropriate. Has f/up growth [place] on 7/21 - follow up in 1 week 2. [redacted] weeks gestation of pregnancy Discussed Tdap. Previously declined. Discussed recommendation and benefits to mom and fetus. Upon shared decision making patient is amenable. Tdap given today  3. Contraception counseling Considering pills or depo or LARC. Handout given to patient  Preterm labor symptoms and general  obstetric precautions including but not limited to vaginal bleeding, contractions, leaking of fluid and fetal movement were reviewed in detail with the patient. Please refer to After Visit Summary for other counseling recommendations.  Return in about 2 weeks (around 10/04/2021) for LROB, any provider.  Warner Mccreedy, MD, MPH OB Fellow, Faculty Practice

## 2021-10-04 ENCOUNTER — Telehealth (INDEPENDENT_AMBULATORY_CARE_PROVIDER_SITE_OTHER): Payer: Medicaid Other | Admitting: Family Medicine

## 2021-10-04 DIAGNOSIS — Z3480 Encounter for supervision of other normal pregnancy, unspecified trimester: Secondary | ICD-10-CM

## 2021-10-04 DIAGNOSIS — Z3A32 32 weeks gestation of pregnancy: Secondary | ICD-10-CM

## 2021-10-04 DIAGNOSIS — Z3483 Encounter for supervision of other normal pregnancy, third trimester: Secondary | ICD-10-CM

## 2021-10-04 DIAGNOSIS — Z3009 Encounter for other general counseling and advice on contraception: Secondary | ICD-10-CM

## 2021-10-04 NOTE — Progress Notes (Addendum)
OBSTETRICS PRENATAL VIRTUAL VISIT ENCOUNTER NOTE  Provider location: Center for [facility]   Patient location: Home  I connected with Kylie Wallace on 10/04/21 at  8:15 AM EDT by MyChart Video Encounter and verified that I am speaking with the correct person using two identifiers. I discussed the limitations, risks, security and privacy concerns of performing an evaluation and management service virtually and the availability of in person appointments. I also discussed with the patient that there may be a patient responsible charge related to this service. The patient expressed understanding and agreed to proceed. Subjective:  Kylie Wallace is a 21 y.o. G2P1001 at [redacted]w[redacted]d being seen today for ongoing prenatal care.  She is currently monitored for the following issues for this low-risk pregnancy and has Supervision of other normal pregnancy, antepartum on their problem list.   #Tingling of face One episode No headache at that time Currently not having symptoms  #White discharge Creamy Few days ago Some intermittent itching No vaginal bleeding or leaking of fluid  Patient reports no complaints.  Contractions: Not present. Vag. Bleeding: None.  Movement: Present. Denies any leaking of fluid.   The following portions of the patient's history were reviewed and updated as appropriate: allergies, current medications, past family history, past medical history, past social history, past surgical history and problem list.   Objective:  Virtual visit - did not have BP cuff to take BP  Fetal Status:     Movement: Present     General:  Alert, oriented and cooperative. Patient is in no acute distress.  Respiratory: Normal respiratory effort, no problems with respiration noted  Mental Status: Normal mood and affect. Normal behavior. Normal judgment and thought content.  Rest of physical exam deferred due to type of encounter   Assessment and Plan:   Pregnancy: G2P1001 at [redacted]w[redacted]d 1. Supervision of other normal pregnancy, antepartum Doing well. No acute concerns.  Patient reports fetal movement. Since virtual visit no physical exam done. - follow up in 2 weeks (already scheduled) - has follow up US scheduled with MFM tomorrow (monitoring for obesity and also to complete anatomy)   2. [redacted] weeks gestation of pregnancy   3. Encounter for counseling regarding contraception Discussed contraception with patient. Would like to do POPs  4. Vaginal discharge Has had creamy white discharge for a few days and intermittent itching. Discussed possible physiological change in discharge vs. Yeast or bacterial infection. Offered patient RN visit to get swab (since this was virtual visit) vs. Getting swab at next appt if she still has symptoms. Opts to wait and get swabbed next visit if still has symptoms  Preterm labor symptoms and general obstetric precautions including but not limited to vaginal bleeding, contractions, leaking of fluid and fetal movement were reviewed in detail with the patient. I discussed the assessment and treatment plan with the patient. The patient was provided an opportunity to ask questions and all were answered. The patient agreed with the plan and demonstrated an understanding of the instructions. The patient was advised to call back or seek an in-person office evaluation/go to MAU at [facility] for any urgent or concerning symptoms. Please refer to After Visit Summary for other counseling recommendations.   I provided 15 minutes of face-to-face time during this encounter.  Return in about 2 weeks (around 10/18/2021) for LROB, any provider.  Future Appointments  Date Time Provider Department Center  10/05/2021  3:30 PM [facility] NURSE [facility]  10/05/2021  3:45 PM [facility] US1 [facility]  10/18/2021  9:55 AM Bernerd Limbo, CNM [facility]  11/01/2021 10:35 AM Brand Males, CNM [facility]  11/08/2021   9:15 AM Federico Flake, MD [facility]  11/15/2021 10:35 AM Crisoforo Oxford, Charlesetta Garibaldi, CNM [facility]    Warner Mccreedy, MD Center for [facility], [facility]

## 2021-10-05 ENCOUNTER — Other Ambulatory Visit: Payer: Self-pay | Admitting: *Deleted

## 2021-10-05 ENCOUNTER — Ambulatory Visit: Payer: Medicaid Other | Admitting: *Deleted

## 2021-10-05 ENCOUNTER — Ambulatory Visit: Payer: Medicaid Other | Attending: Maternal & Fetal Medicine

## 2021-10-05 VITALS — BP 119/68 | HR 98

## 2021-10-05 DIAGNOSIS — Z3A32 32 weeks gestation of pregnancy: Secondary | ICD-10-CM | POA: Diagnosis not present

## 2021-10-05 DIAGNOSIS — E669 Obesity, unspecified: Secondary | ICD-10-CM

## 2021-10-05 DIAGNOSIS — Z3689 Encounter for other specified antenatal screening: Secondary | ICD-10-CM | POA: Insufficient documentation

## 2021-10-05 DIAGNOSIS — O99213 Obesity complicating pregnancy, third trimester: Secondary | ICD-10-CM | POA: Insufficient documentation

## 2021-10-05 DIAGNOSIS — O288 Other abnormal findings on antenatal screening of mother: Secondary | ICD-10-CM | POA: Diagnosis not present

## 2021-10-05 DIAGNOSIS — Z3480 Encounter for supervision of other normal pregnancy, unspecified trimester: Secondary | ICD-10-CM | POA: Insufficient documentation

## 2021-10-08 ENCOUNTER — Other Ambulatory Visit: Payer: Self-pay | Admitting: *Deleted

## 2021-10-08 DIAGNOSIS — O288 Other abnormal findings on antenatal screening of mother: Secondary | ICD-10-CM

## 2021-10-12 ENCOUNTER — Ambulatory Visit: Payer: Medicaid Other | Admitting: *Deleted

## 2021-10-12 ENCOUNTER — Ambulatory Visit: Payer: Medicaid Other | Attending: Obstetrics and Gynecology

## 2021-10-12 VITALS — BP 110/55 | HR 92

## 2021-10-12 DIAGNOSIS — O99213 Obesity complicating pregnancy, third trimester: Secondary | ICD-10-CM

## 2021-10-12 DIAGNOSIS — O288 Other abnormal findings on antenatal screening of mother: Secondary | ICD-10-CM | POA: Diagnosis present

## 2021-10-12 DIAGNOSIS — Z3A33 33 weeks gestation of pregnancy: Secondary | ICD-10-CM

## 2021-10-12 DIAGNOSIS — O283 Abnormal ultrasonic finding on antenatal screening of mother: Secondary | ICD-10-CM

## 2021-10-12 DIAGNOSIS — E669 Obesity, unspecified: Secondary | ICD-10-CM | POA: Diagnosis not present

## 2021-10-12 DIAGNOSIS — Z6835 Body mass index (BMI) 35.0-35.9, adult: Secondary | ICD-10-CM | POA: Insufficient documentation

## 2021-10-15 ENCOUNTER — Other Ambulatory Visit: Payer: Self-pay | Admitting: *Deleted

## 2021-10-15 DIAGNOSIS — Z6835 Body mass index (BMI) 35.0-35.9, adult: Secondary | ICD-10-CM

## 2021-10-15 DIAGNOSIS — O99213 Obesity complicating pregnancy, third trimester: Secondary | ICD-10-CM

## 2021-10-17 ENCOUNTER — Ambulatory Visit (HOSPITAL_BASED_OUTPATIENT_CLINIC_OR_DEPARTMENT_OTHER): Payer: Medicaid Other

## 2021-10-17 ENCOUNTER — Ambulatory Visit: Payer: Medicaid Other | Attending: Obstetrics and Gynecology | Admitting: *Deleted

## 2021-10-17 VITALS — BP 123/64 | HR 85

## 2021-10-17 DIAGNOSIS — Z3A34 34 weeks gestation of pregnancy: Secondary | ICD-10-CM | POA: Insufficient documentation

## 2021-10-17 DIAGNOSIS — Z6835 Body mass index (BMI) 35.0-35.9, adult: Secondary | ICD-10-CM

## 2021-10-17 DIAGNOSIS — O99213 Obesity complicating pregnancy, third trimester: Secondary | ICD-10-CM | POA: Diagnosis present

## 2021-10-17 DIAGNOSIS — E669 Obesity, unspecified: Secondary | ICD-10-CM

## 2021-10-18 ENCOUNTER — Other Ambulatory Visit: Payer: Self-pay

## 2021-10-18 ENCOUNTER — Other Ambulatory Visit (HOSPITAL_COMMUNITY)
Admission: RE | Admit: 2021-10-18 | Discharge: 2021-10-18 | Disposition: A | Discharge status: Home / Self Care | Payer: Medicaid Other | Source: Ambulatory Visit | Attending: Certified Nurse Midwife | Admitting: Certified Nurse Midwife

## 2021-10-18 ENCOUNTER — Ambulatory Visit (INDEPENDENT_AMBULATORY_CARE_PROVIDER_SITE_OTHER): Payer: Medicaid Other | Admitting: Certified Nurse Midwife

## 2021-10-18 VITALS — BP 126/85 | HR 97 | Wt 225.7 lb

## 2021-10-18 DIAGNOSIS — N898 Other specified noninflammatory disorders of vagina: Secondary | ICD-10-CM

## 2021-10-18 DIAGNOSIS — O23593 Infection of other part of genital tract in pregnancy, third trimester: Secondary | ICD-10-CM | POA: Insufficient documentation

## 2021-10-18 DIAGNOSIS — Z3480 Encounter for supervision of other normal pregnancy, unspecified trimester: Secondary | ICD-10-CM | POA: Insufficient documentation

## 2021-10-18 DIAGNOSIS — O98813 Other maternal infectious and parasitic diseases complicating pregnancy, third trimester: Secondary | ICD-10-CM | POA: Insufficient documentation

## 2021-10-18 DIAGNOSIS — O26893 Other specified pregnancy related conditions, third trimester: Secondary | ICD-10-CM | POA: Diagnosis present

## 2021-10-18 DIAGNOSIS — Z3A34 34 weeks gestation of pregnancy: Secondary | ICD-10-CM

## 2021-10-18 DIAGNOSIS — B9689 Other specified bacterial agents as the cause of diseases classified elsewhere: Secondary | ICD-10-CM | POA: Diagnosis not present

## 2021-10-18 DIAGNOSIS — B3731 Acute candidiasis of vulva and vagina: Secondary | ICD-10-CM | POA: Diagnosis not present

## 2021-10-18 DIAGNOSIS — Z3403 Encounter for supervision of normal first pregnancy, third trimester: Secondary | ICD-10-CM

## 2021-10-18 NOTE — Progress Notes (Signed)
   PRENATAL VISIT NOTE  Subjective:  Kylie Wallace is a 21 y.o. G2P1001 at [redacted]w[redacted]d being seen today for ongoing prenatal care.  She is currently monitored for the following issues for this low-risk pregnancy and has Supervision of other normal pregnancy, antepartum on their problem list.  Patient reports vaginal irritation and creamy white discharge .  Contractions: Not present. Vag. Bleeding: None.  Movement: Present. Denies leaking of fluid.   The following portions of the patient's history were reviewed and updated as appropriate: allergies, current medications, past family history, past medical history, past social history, past surgical history and problem list.   Objective:   Vitals:   10/18/21 1027  BP: 126/85  Pulse: 97  Weight: 225 lb 11.2 oz (102.4 kg)    Fetal Status: Fetal Heart Rate (bpm): 145 Fundal Height: 35 cm Movement: Present     General:  Alert, oriented and cooperative. Patient is in no acute distress.  Skin: Skin is warm and dry. No rash noted.   Cardiovascular: Normal heart rate noted  Respiratory: Normal respiratory effort, no problems with respiration noted  Abdomen: Soft, gravid, appropriate for gestational age.  Pain/Pressure: Present     Pelvic: Cervical exam deferred        Extremities: Normal range of motion.  Edema: Trace  Mental Status: Normal mood and affect. Normal behavior. Normal judgment and thought content.   Assessment and Plan:  Pregnancy: G2P1001 at [redacted]w[redacted]d 1. Supervision of low-risk first pregnancy, third trimester - Doing well, feeling regular and vigorous fetal movement   2. [redacted] weeks gestation of pregnancy - Routine OB carec including anticipatory guidance re GBS and how to self-collect at next visit  3. Vaginal discharge - Cervicovaginal ancillary only( [facility])  Preterm labor symptoms and general obstetric precautions including but not limited to vaginal bleeding, contractions, leaking of fluid and fetal movement were reviewed in  detail with the patient. Please refer to After Visit Summary for other counseling recommendations.   Return in about 2 weeks (around 11/01/2021) for IN-PERSON, LOB.  Future Appointments  Date Time Provider Department Center  10/26/2021 10:30 AM [facility] NURSE [facility]  10/26/2021 10:45 AM [facility] US7 [facility]  11/01/2021 10:35 AM Brand Males, CNM [facility]  11/02/2021  3:30 PM [facility] NURSE [facility]  11/02/2021  3:45 PM [facility] US1 [facility]  11/08/2021  9:15 AM Federico Flake, MD [facility]  11/15/2021 10:35 AM Crisoforo Oxford, Charlesetta Garibaldi, CNM [facility]    Bernerd Limbo, CNM

## 2021-10-18 NOTE — Progress Notes (Signed)
Pt reoprts creamy white discharge with itching.

## 2021-10-19 ENCOUNTER — Ambulatory Visit: Payer: Medicaid Other

## 2021-10-19 ENCOUNTER — Other Ambulatory Visit: Payer: Medicaid Other

## 2021-10-19 LAB — CERVICOVAGINAL ANCILLARY ONLY
Bacterial Vaginitis (gardnerella): POSITIVE — AB
Candida Glabrata: NEGATIVE
Candida Vaginitis: POSITIVE — AB
Chlamydia: NEGATIVE
Comment: NEGATIVE
Comment: NEGATIVE
Comment: NEGATIVE
Comment: NEGATIVE
Comment: NEGATIVE
Comment: NORMAL
Neisseria Gonorrhea: NEGATIVE
Trichomonas: NEGATIVE

## 2021-10-21 MED ORDER — FLUCONAZOLE 150 MG PO TABS: 150.0000 mg | ORAL_TABLET | Freq: Every day | ORAL | 1 refills | Status: DC

## 2021-10-21 MED ORDER — METRONIDAZOLE 0.75 % VA GEL: 1.0000 | Freq: Every day | VAGINAL | 1 refills | Status: DC

## 2021-10-21 NOTE — Addendum Note (Signed)
Addended by: Edd Arbour on: 10/21/2021 10:55 AM   Modules accepted: Orders

## 2021-10-23 ENCOUNTER — Other Ambulatory Visit: Payer: Self-pay

## 2021-10-23 ENCOUNTER — Ambulatory Visit (INDEPENDENT_AMBULATORY_CARE_PROVIDER_SITE_OTHER): Payer: Medicaid Other

## 2021-10-23 ENCOUNTER — Encounter: Payer: Self-pay | Admitting: Certified Nurse Midwife

## 2021-10-23 VITALS — BP 119/80 | Wt 227.9 lb

## 2021-10-23 DIAGNOSIS — Z0130 Encounter for examination of blood pressure without abnormal findings: Secondary | ICD-10-CM

## 2021-10-23 DIAGNOSIS — Z3480 Encounter for supervision of other normal pregnancy, unspecified trimester: Secondary | ICD-10-CM

## 2021-10-23 NOTE — Progress Notes (Signed)
Blood Pressure Check Visit  Kylie Wallace is here for blood pressure check at 35w 2d of pregnancy. Pt reports pain under right rib began Sunday, 10/21/21 and is noticed with any movement, cough, or sneeze. Reports intermittent headaches that began yesterday and dizziness that began today. BP today is 119/80. Pt reports taking Tylenol 325 mg once at approx 11 AM. Reviewed with Alvester Morin MD who states pain is likely due to position of baby, recommends patient increase hydration, and continue to monitor symptoms at home. Reviewed providers recommendation with patient. Encouraged pt to take 2 regular strength Tylenol when she returns home for headache. Reviewed max dose 4 g Tylenol in 24 hours. Pt given BP cuff and education provided. Pt to document in [employer].  Marjo Bicker, RN 10/23/2021  1:34 PM

## 2021-10-26 ENCOUNTER — Ambulatory Visit (HOSPITAL_BASED_OUTPATIENT_CLINIC_OR_DEPARTMENT_OTHER): Payer: Medicaid Other | Admitting: Obstetrics

## 2021-10-26 ENCOUNTER — Ambulatory Visit: Payer: Medicaid Other | Attending: Obstetrics | Admitting: *Deleted

## 2021-10-26 ENCOUNTER — Ambulatory Visit: Payer: Medicaid Other | Attending: Obstetrics

## 2021-10-26 ENCOUNTER — Other Ambulatory Visit: Payer: Self-pay | Admitting: Obstetrics

## 2021-10-26 ENCOUNTER — Other Ambulatory Visit: Payer: Self-pay

## 2021-10-26 ENCOUNTER — Ambulatory Visit: Payer: Medicaid Other | Admitting: *Deleted

## 2021-10-26 ENCOUNTER — Inpatient Hospital Stay (HOSPITAL_COMMUNITY)
Admission: AD | Admit: 2021-10-26 | Discharge: 2021-10-26 | Disposition: A | Discharge status: Home / Self Care | Payer: Medicaid Other | Attending: Obstetrics & Gynecology | Admitting: Obstetrics & Gynecology

## 2021-10-26 ENCOUNTER — Encounter (HOSPITAL_COMMUNITY): Payer: Self-pay | Admitting: Obstetrics & Gynecology

## 2021-10-26 VITALS — BP 124/71 | HR 84

## 2021-10-26 DIAGNOSIS — O4103X1 Oligohydramnios, third trimester, fetus 1: Secondary | ICD-10-CM

## 2021-10-26 DIAGNOSIS — O368130 Decreased fetal movements, third trimester, not applicable or unspecified: Secondary | ICD-10-CM | POA: Insufficient documentation

## 2021-10-26 DIAGNOSIS — O368390 Maternal care for abnormalities of the fetal heart rate or rhythm, unspecified trimester, not applicable or unspecified: Secondary | ICD-10-CM | POA: Diagnosis not present

## 2021-10-26 DIAGNOSIS — Z3A35 35 weeks gestation of pregnancy: Secondary | ICD-10-CM

## 2021-10-26 DIAGNOSIS — Z6835 Body mass index (BMI) 35.0-35.9, adult: Secondary | ICD-10-CM

## 2021-10-26 DIAGNOSIS — O99213 Obesity complicating pregnancy, third trimester: Secondary | ICD-10-CM | POA: Diagnosis not present

## 2021-10-26 DIAGNOSIS — O4103X0 Oligohydramnios, third trimester, not applicable or unspecified: Secondary | ICD-10-CM | POA: Insufficient documentation

## 2021-10-26 DIAGNOSIS — E669 Obesity, unspecified: Secondary | ICD-10-CM | POA: Insufficient documentation

## 2021-10-26 DIAGNOSIS — Z3689 Encounter for other specified antenatal screening: Secondary | ICD-10-CM

## 2021-10-26 NOTE — MAU Note (Signed)
[name] is a 21 y.o. at [redacted]w[redacted]d here in MAU reporting: sent from MD office for monitoring.  Reports fetal heart rate elevated during monitoring @ office visit.  Denies VB or LOF.  Reports +FM. LMP: N/A Onset of complaint: today Pain score: 4/10 Vitals:   10/26/21 1254  BP: 118/74  Pulse: (!) 101  Resp: 19  Temp: 98.2 F (36.8 C)  SpO2: 100%     FHT: 166 bpm Lab orders placed from triage:   None

## 2021-10-26 NOTE — Procedures (Signed)
Jeri Lona 07/13/2000 [redacted]w[redacted]d  Fetus A Non-Stress Test Interpretation for 10/26/21  Indication: Decreased Fetal Movement  Fetal Heart Rate A Mode: External Baseline Rate (A): 170 bpm Variability: Moderate Accelerations: 15 x 15 Decelerations: Variable Multiple birth?: No  Uterine Activity Mode: Toco Contraction Frequency (min): none Resting Tone Palpated: Relaxed  Interpretation (Fetal Testing) Nonstress Test Interpretation: Reactive Overall Impression: Reassuring for gestational age Comments: tracing reviewed by Dr. Fang   

## 2021-10-26 NOTE — Procedures (Signed)
Kylie Wallace 01-08-2001 [redacted]w[redacted]d  Fetus A Non-Stress Test Interpretation for 10/26/21  Indication: Decreased Fetal Movement  Fetal Heart Rate A Mode: External Baseline Rate (A): 170 bpm Variability: Moderate Accelerations: 15 x 15 Decelerations: Variable Multiple birth?: No  Uterine Activity Mode: Toco Contraction Frequency (min): none Resting Tone Palpated: Relaxed  Interpretation (Fetal Testing) Nonstress Test Interpretation: Reactive Overall Impression: Reassuring for gestational age Comments: tracing reviewed by Dr. Parke Poisson

## 2021-10-26 NOTE — MAU Provider Note (Signed)
History     CSN: [id number]  Arrival date and time: 10/26/21 1232   Event Date/Time   First Provider Initiated Contact with Patient 10/26/21 1325      Chief Complaint  Patient presents with   EFM   HPI  Ms. Kloi Brodman [id number] @ [redacted]w[redacted]d here in MAU for fetal monitoring. She was seen in MFM today and had a BPP which was 10/10. Babies HR on external monitor was in the 170's and she was sent here for further monitoring. She has no pain or bleeding.   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   OB History     Gravida  2   Para  1   Term  1   Preterm  0   AB  0   Living  1      SAB  0   IAB  0   Ectopic  0   Multiple  0   Live Births  1           Past Medical History:  Diagnosis Date   Medical history non-contributory     Past Surgical History:  Procedure Laterality Date   WISDOM TOOTH EXTRACTION      Family History  Problem Relation Age of Onset   Hypertension Mother    Healthy Father    Asthma Sister    Diabetes Maternal Grandmother    Diabetes Paternal Grandmother    Birth defects Neg Hx    Cancer Neg Hx    Heart disease Neg Hx    Stroke Neg Hx     Social History   Tobacco Use   Smoking status: Never   Smokeless tobacco: Never  Vaping Use   Vaping Use: Never used  Substance Use Topics   Alcohol use: No   Drug use: No    Allergies: No Known Allergies  Medications Prior to Admission  Medication Sig Dispense Refill Last Dose   fluconazole (DIFLUCAN) 150 MG tablet Take 1 tablet (150 mg total) by mouth daily. 1 tablet 1    metroNIDAZOLE (METROGEL) 0.75 % vaginal gel Place 1 Applicatorful vaginally at bedtime. Apply one applicatorful to vagina at bedtime for 5 days 70 g 1 10/25/2021   Prenatal Vit-Fe Fumarate-FA (PRENATAL PO) Take by mouth.   10/26/2021 at 1000   ferrous sulfate 325 (65 FE) MG EC tablet Take 1 tablet (325 mg total) by mouth every other day. 30 tablet 2    No results found for this or any previous visit (from  the past 48 hour(s)).   Review of Systems  Gastrointestinal:  Negative for abdominal pain.  Genitourinary:  Negative for vaginal bleeding and vaginal discharge.   Physical Exam   Blood pressure 113/66, pulse 93, temperature 98.3 F (36.8 C), temperature source Tympanic, resp. rate 20, height 5\' 4"  (1.626 m), weight 103.2 kg, last menstrual period 02/18/2021, SpO2 100 %.  Physical Exam Constitutional:      General: She is not in acute distress.    Appearance: Normal appearance. She is not ill-appearing, toxic-appearing or diaphoretic.  Musculoskeletal:        General: Normal range of motion.  Neurological:     Mental Status: She is alert and oriented to person, place, and time.  Psychiatric:        Behavior: Behavior normal.   Fetal Tracing: Baseline: 140 bpm Variability: Moderate  Accelerations: 15x15 Decelerations: None Toco:  Quiet.   MAU Course  Procedures  MDM  Reactive NST   Assessment  and Plan   A:  1. NST (non-stress test) reactive   2. [redacted] weeks gestation of pregnancy      P:  Dc home  Return to MAU if symptoms worsen Follow up with MFM   Arshia Spellman, Harolyn Rutherford, NP. 10/26/2021 2:04 PM

## 2021-10-26 NOTE — Progress Notes (Signed)
MFM Note  Kylie Wallace was seen for a BPP due to low normal amniotic fluid levels that has been noted on her prior ultrasound exams.  She denies any leakage of fluid.  She has noted decreased fetal movements over the last day or so.  A biophysical profile performed today was 10 out of 10 with a reactive NST.    During the performance of her NST, the baseline fetal heart rate was noted to be persistently in the 170s to 180s range over a span of 60 minutes.  The total AFI of 8.78  cm remains stable as compared to her exam last week.  Due to persistent fetal tachycardia, the patient was sent to the MAU for prolonged monitoring and IV hydration following today's ultrasound exam.  Should the patient be discharged home from the MAU, she will return to our office in 1 week for another BPP, fluid check, and growth scan.  The patient stated that all of her questions were answered today.  A total of 20 minutes was spent counseling and coordinating the care for this patient.  Greater than 50% of the time was spent in direct face-to-face contact.

## 2021-11-01 ENCOUNTER — Other Ambulatory Visit: Payer: Self-pay

## 2021-11-01 ENCOUNTER — Other Ambulatory Visit (HOSPITAL_COMMUNITY)
Admission: RE | Admit: 2021-11-01 | Discharge: 2021-11-01 | Disposition: A | Discharge status: Home / Self Care | Payer: Medicaid Other | Source: Ambulatory Visit

## 2021-11-01 ENCOUNTER — Ambulatory Visit (INDEPENDENT_AMBULATORY_CARE_PROVIDER_SITE_OTHER): Payer: Medicaid Other

## 2021-11-01 VITALS — BP 117/83 | HR 100 | Wt 229.2 lb

## 2021-11-01 DIAGNOSIS — Z3480 Encounter for supervision of other normal pregnancy, unspecified trimester: Secondary | ICD-10-CM

## 2021-11-01 DIAGNOSIS — Z3A36 36 weeks gestation of pregnancy: Secondary | ICD-10-CM

## 2021-11-01 NOTE — Progress Notes (Signed)
   PRENATAL VISIT NOTE  Subjective:  Kylie Wallace is a 21 y.o. G2P1001 at [redacted]w[redacted]d being seen today for ongoing prenatal care.  She is currently monitored for the following issues for this low-risk pregnancy and has Supervision of other normal pregnancy, antepartum on their problem list.  Patient reports no complaints.  Contractions: Irritability. Vag. Bleeding: None.  Movement: Present. Denies leaking of fluid.   The following portions of the patient's history were reviewed and updated as appropriate: allergies, current medications, past family history, past medical history, past social history, past surgical history and problem list.   Objective:   Vitals:   11/01/21 1056  BP: 117/83  Pulse: 100  Weight: 229 lb 3.2 oz (104 kg)    Fetal Status: Fetal Heart Rate (bpm): 141   Movement: Present     General:  Alert, oriented and cooperative. Patient is in no acute distress.  Skin: Skin is warm and dry. No rash noted.   Cardiovascular: Normal heart rate noted  Respiratory: Normal respiratory effort, no problems with respiration noted  Abdomen: Soft, gravid, appropriate for gestational age.  Pain/Pressure: Absent     Pelvic: Cervical exam performed in the presence of a chaperone        Extremities: Normal range of motion.  Edema: Trace  Mental Status: Normal mood and affect. Normal behavior. Normal judgment and thought content.   Assessment and Plan:  Pregnancy: G2P1001 at [redacted]w[redacted]d 1. Supervision of other normal pregnancy, antepartum - Routine PB. Doing well, no concerns - Anticipatory guidance for upcoming appointments provided - [place] at MFM tomorrow - Requesting cervical exam today  - GC/Chlamydia probe amp ([place])not at [facility] - Culture, beta strep (group b only)  2. [redacted] weeks gestation of pregnancy   Preterm labor symptoms and general obstetric precautions including but not limited to vaginal bleeding, contractions, leaking of fluid and fetal movement were reviewed in detail  with the patient. Please refer to After Visit Summary for other counseling recommendations.   Return in about 1 week (around 11/08/2021).  Future Appointments  Date Time Provider Department Center  11/02/2021  3:30 PM [facility] NURSE [facility]  11/02/2021  3:45 PM [facility] US1 [facility]  11/08/2021  9:15 AM Federico Flake, MD [facility]  11/15/2021 10:35 AM Crisoforo Oxford, Charlesetta Garibaldi, CNM [facility]    Brand Males, CNM

## 2021-11-02 ENCOUNTER — Other Ambulatory Visit: Payer: Self-pay | Admitting: Obstetrics and Gynecology

## 2021-11-02 ENCOUNTER — Encounter (HOSPITAL_COMMUNITY): Payer: Self-pay | Admitting: Obstetrics & Gynecology

## 2021-11-02 ENCOUNTER — Inpatient Hospital Stay (HOSPITAL_COMMUNITY)
Admission: AD | Admit: 2021-11-02 | Discharge: 2021-11-02 | Disposition: A | Discharge status: Home / Self Care | Payer: Medicaid Other | Attending: Obstetrics & Gynecology | Admitting: Obstetrics & Gynecology

## 2021-11-02 ENCOUNTER — Ambulatory Visit: Payer: Medicaid Other | Attending: Obstetrics and Gynecology

## 2021-11-02 ENCOUNTER — Ambulatory Visit: Payer: Medicaid Other | Admitting: *Deleted

## 2021-11-02 VITALS — BP 125/74 | HR 86

## 2021-11-02 DIAGNOSIS — Z6835 Body mass index (BMI) 35.0-35.9, adult: Secondary | ICD-10-CM | POA: Insufficient documentation

## 2021-11-02 DIAGNOSIS — Z3480 Encounter for supervision of other normal pregnancy, unspecified trimester: Secondary | ICD-10-CM

## 2021-11-02 DIAGNOSIS — O35BXX0 Maternal care for other (suspected) fetal abnormality and damage, fetal cardiac anomalies, not applicable or unspecified: Secondary | ICD-10-CM

## 2021-11-02 DIAGNOSIS — O23593 Infection of other part of genital tract in pregnancy, third trimester: Secondary | ICD-10-CM | POA: Insufficient documentation

## 2021-11-02 DIAGNOSIS — O4103X0 Oligohydramnios, third trimester, not applicable or unspecified: Secondary | ICD-10-CM | POA: Insufficient documentation

## 2021-11-02 DIAGNOSIS — Z3A36 36 weeks gestation of pregnancy: Secondary | ICD-10-CM

## 2021-11-02 DIAGNOSIS — O368330 Maternal care for abnormalities of the fetal heart rate or rhythm, third trimester, not applicable or unspecified: Secondary | ICD-10-CM | POA: Diagnosis not present

## 2021-11-02 DIAGNOSIS — O42913 Preterm premature rupture of membranes, unspecified as to length of time between rupture and onset of labor, third trimester: Secondary | ICD-10-CM | POA: Diagnosis not present

## 2021-11-02 DIAGNOSIS — B9689 Other specified bacterial agents as the cause of diseases classified elsewhere: Secondary | ICD-10-CM | POA: Diagnosis not present

## 2021-11-02 DIAGNOSIS — O288 Other abnormal findings on antenatal screening of mother: Secondary | ICD-10-CM

## 2021-11-02 DIAGNOSIS — B3731 Acute candidiasis of vulva and vagina: Secondary | ICD-10-CM | POA: Diagnosis not present

## 2021-11-02 DIAGNOSIS — O99213 Obesity complicating pregnancy, third trimester: Secondary | ICD-10-CM

## 2021-11-02 DIAGNOSIS — Z3689 Encounter for other specified antenatal screening: Secondary | ICD-10-CM

## 2021-11-02 DIAGNOSIS — O98813 Other maternal infectious and parasitic diseases complicating pregnancy, third trimester: Secondary | ICD-10-CM | POA: Diagnosis not present

## 2021-11-02 DIAGNOSIS — E669 Obesity, unspecified: Secondary | ICD-10-CM | POA: Diagnosis not present

## 2021-11-02 LAB — WET PREP, GENITAL
Sperm: NONE SEEN
Trich, Wet Prep: NONE SEEN
WBC, Wet Prep HPF POC: >=10 — AB (ref <10)

## 2021-11-02 LAB — GC/CHLAMYDIA PROBE AMP (~~LOC~~) NOT AT ARMC
Chlamydia: NEGATIVE
Comment: NEGATIVE
Comment: NORMAL
Neisseria Gonorrhea: NEGATIVE

## 2021-11-02 LAB — AMNISURE RUPTURE OF MEMBRANE (ROM) NOT AT ARMC: Amnisure ROM: NEGATIVE

## 2021-11-02 MED ORDER — TERCONAZOLE 0.4 % VA CREA: 1.0000 | TOPICAL_CREAM | Freq: Every day | VAGINAL | 0 refills | Status: DC

## 2021-11-02 MED ORDER — METRONIDAZOLE 500 MG PO TABS: 500.0000 mg | ORAL_TABLET | Freq: Two times a day (BID) | ORAL | 0 refills | Status: DC

## 2021-11-02 MED ORDER — LACTATED RINGERS IV BOLUS
1000.0000 mL | Freq: Once | INTRAVENOUS | Status: AC
  Administered 2021-11-02: 1000 mL via INTRAVENOUS

## 2021-11-02 NOTE — MAU Provider Note (Addendum)
History    CSN: [id number]  Arrival date and time: 11/02/21 1803  None    Chief Complaint  Patient presents with   oligohydromonos     y   HPI Kylie Wallace is a 21 y.o. G2P1001 at [redacted]w[redacted]d who presents to MAU from MFM for rule out rupture of membranes. Patient was noted to have oligohydramnios on today's ultrasound. Patient reports intermittently leaking clear fluid for the past 2 days. She initially thought it was urine. It is enough to make underwear wet, but she has not had to wear a pad. She denies itching or odor, no vaginal bleeding or urinary s/s. She reports some mild cramping that she noticed when the leaking started, but it is not consistent. She endorses active fetal movement.   Patient receives prenatal care at [facility] and next appointment is scheduled on 8/23.  OB History     Gravida  2   Para  1   Term  1   Preterm  0   AB  0   Living  1      SAB  0   IAB  0   Ectopic  0   Multiple  0   Live Births  1          Past Medical History:  Diagnosis Date   Medical history non-contributory    Past Surgical History:  Procedure Laterality Date   WISDOM TOOTH EXTRACTION     Family History  Problem Relation Age of Onset   Hypertension Mother    Healthy Father    Asthma Sister    Diabetes Maternal Grandmother    Diabetes Paternal Grandmother    Birth defects Neg Hx    Cancer Neg Hx    Heart disease Neg Hx    Stroke Neg Hx    Social History   Tobacco Use   Smoking status: Never   Smokeless tobacco: Never  Vaping Use   Vaping Use: Never used  Substance Use Topics   Alcohol use: No   Drug use: No   Allergies: No Known Allergies  Medications Prior to Admission  Medication Sig Dispense Refill Last Dose   Prenatal Vit-Fe Fumarate-FA (PRENATAL PO) Take by mouth.   11/02/2021   Review of Systems  Constitutional: Negative.   Respiratory: Negative.    Gastrointestinal:  Positive for abdominal pain (cramping).  Genitourinary:  Positive for  vaginal discharge. Negative for dysuria and vaginal bleeding.  Neurological: Negative.    Physical Exam   Blood pressure 131/75, pulse 85, temperature 98.1 F (36.7 C), temperature source Oral, resp. rate 18, height 5\' 4"  (1.626 m), weight 103.4 kg, last menstrual period 02/18/2021.  Physical Exam Vitals and nursing note reviewed. Exam conducted with a chaperone present.  Constitutional:      General: She is not in acute distress. Eyes:     Extraocular Movements: Extraocular movements intact.     Pupils: Pupils are equal, round, and reactive to light.  Cardiovascular:     Rate and Rhythm: Normal rate.  Pulmonary:     Effort: Pulmonary effort is normal.  Abdominal:     Palpations: Abdomen is soft.     Tenderness: There is no abdominal tenderness.     Comments: gravid  Genitourinary:    Comments: Normal external female genitalia, vaginal walls pink with rugae, moderate amount of yellow, curdy discharge likely c/w yeast, scant amount of thin watery discharge, no bleeding, cervix visually closed without lesions/masses Musculoskeletal:     Cervical back: Normal  range of motion.  Neurological:     Mental Status: She is alert.    Fern slide: negative NST FHR: 135 bpm, moderate variability, +15x15 accels, no decels Toco: quiet  MAU Course  Procedures  MDM SSE, fern slide, wet prep, amnisure IVF bolus per MFM recommendation Care handed to Tyler Aas, CNM at [address] CNM 11/02/21, 7:41 PM  Assumed care of patient. Wet prep showed pt still has yeast and BV despite taking previous treatment as ordered. LR bolus finished, NST continued to be reactive. Discussed presence of BV/yeast with patient and prescribed flagyl and terconazole this time. Also advised patient to eat well and hydrate to help fluid levels improve. Has follow up next week.  Assessment and Plan  Oligohydramnios, third trimester Bacterial vaginosis Yeast vaginitis NST reactive [redacted] weeks  gestation of pregnancy  Discharge home in stable condition with preterm labor precautions Follow up at [facility] and MFM as scheduled for ongoing prenatal care  Allergies as of 11/02/2021   No Known Allergies      Medication List     TAKE these medications    metroNIDAZOLE 500 MG tablet Commonly known as: FLAGYL Take 1 tablet (500 mg total) by mouth 2 (two) times daily.   PRENATAL PO Take by mouth.   terconazole 0.4 % vaginal cream Commonly known as: TERAZOL 7 Place 1 applicator vaginally at bedtime.       Edd Arbour, CNM, MSN, IBCLC Certified Nurse Midwife, [facility]

## 2021-11-02 NOTE — MAU Note (Signed)
.  Kylie Wallace is a 21 y.o. at [redacted]w[redacted]d here in MAU reporting: sent from MFM for low fluid level. Pt denies any vag bleeding or leaking. Good fetal movement felt. State she is having some cramping occasionally   Onset of complaint: today Pain score: 0 Vitals:   11/02/21 1822  BP: 131/75  Pulse: 85  Resp: 18  Temp: 98.1 F (36.7 C)     FHT:166 Lab orders placed from triage:

## 2021-11-05 ENCOUNTER — Other Ambulatory Visit: Payer: Self-pay | Admitting: *Deleted

## 2021-11-05 DIAGNOSIS — O288 Other abnormal findings on antenatal screening of mother: Secondary | ICD-10-CM

## 2021-11-05 LAB — CULTURE, BETA STREP (GROUP B ONLY): Strep Gp B Culture: NEGATIVE

## 2021-11-05 NOTE — Progress Notes (Unsigned)
   PRENATAL VISIT NOTE  Subjective:  Kylie Wallace is a 21 y.o. G2P1001 at [redacted]w[redacted]d being seen today for ongoing prenatal care.  She is currently monitored for the following issues for this {Blank single:19197::"high-risk","low-risk"} pregnancy and has Supervision of other normal pregnancy, antepartum on their problem list.  Patient reports {sx:14538}.   .  .   . Denies leaking of fluid.   The following portions of the patient's history were reviewed and updated as appropriate: allergies, current medications, past family history, past medical history, past social history, past surgical history and problem list.   Objective:  There were no vitals filed for this visit.  Fetal Status:           General:  Alert, oriented and cooperative. Patient is in no acute distress.  Skin: Skin is warm and dry. No rash noted.   Cardiovascular: Normal heart rate noted  Respiratory: Normal respiratory effort, no problems with respiration noted  Abdomen: Soft, gravid, appropriate for gestational age.        Pelvic: {Blank single:19197::"Cervical exam performed in the presence of a chaperone","Cervical exam deferred"}        Extremities: Normal range of motion.     Mental Status: Normal mood and affect. Normal behavior. Normal judgment and thought content.   Assessment and Plan:  Pregnancy: G2P1001 at [redacted]w[redacted]d 1. Supervision of other normal pregnancy, antepartum ***  {Blank single:19197::"Term","Preterm"} labor symptoms and general obstetric precautions including but not limited to vaginal bleeding, contractions, leaking of fluid and fetal movement were reviewed in detail with the patient. Please refer to After Visit Summary for other counseling recommendations.   No follow-ups on file.  Future Appointments  Date Time Provider Department Center  11/07/2021 10:35 AM Federico Flake, MD [facility]  11/09/2021  2:30 PM [facility] NURSE [facility]  11/09/2021  2:45 PM [facility] US6 [facility]  11/15/2021  10:35 AM Crisoforo Oxford, Charlesetta Garibaldi, CNM [facility]    Federico Flake, MD

## 2021-11-07 ENCOUNTER — Ambulatory Visit (INDEPENDENT_AMBULATORY_CARE_PROVIDER_SITE_OTHER): Payer: Medicaid Other | Admitting: Family Medicine

## 2021-11-07 ENCOUNTER — Other Ambulatory Visit: Payer: Self-pay

## 2021-11-07 VITALS — BP 122/75 | HR 101 | Wt 228.5 lb

## 2021-11-07 DIAGNOSIS — Z3480 Encounter for supervision of other normal pregnancy, unspecified trimester: Secondary | ICD-10-CM

## 2021-11-08 ENCOUNTER — Encounter: Payer: Medicaid Other | Admitting: Family Medicine

## 2021-11-09 ENCOUNTER — Ambulatory Visit: Payer: Medicaid Other | Attending: Maternal & Fetal Medicine

## 2021-11-09 ENCOUNTER — Other Ambulatory Visit: Payer: Self-pay | Admitting: Maternal & Fetal Medicine

## 2021-11-09 ENCOUNTER — Ambulatory Visit: Payer: Medicaid Other | Admitting: *Deleted

## 2021-11-09 VITALS — BP 123/80 | HR 101

## 2021-11-09 DIAGNOSIS — E669 Obesity, unspecified: Secondary | ICD-10-CM | POA: Diagnosis not present

## 2021-11-09 DIAGNOSIS — O368390 Maternal care for abnormalities of the fetal heart rate or rhythm, unspecified trimester, not applicable or unspecified: Secondary | ICD-10-CM

## 2021-11-09 DIAGNOSIS — O288 Other abnormal findings on antenatal screening of mother: Secondary | ICD-10-CM

## 2021-11-09 DIAGNOSIS — Z3480 Encounter for supervision of other normal pregnancy, unspecified trimester: Secondary | ICD-10-CM

## 2021-11-09 DIAGNOSIS — Z3A37 37 weeks gestation of pregnancy: Secondary | ICD-10-CM | POA: Diagnosis not present

## 2021-11-09 DIAGNOSIS — O99213 Obesity complicating pregnancy, third trimester: Secondary | ICD-10-CM | POA: Diagnosis not present

## 2021-11-09 NOTE — Procedures (Signed)
Kylie Wallace June 06, 2000 [redacted]w[redacted]d  Fetus A Non-Stress Test Interpretation for 11/09/21  Indication:  fetal tachycardia  Fetal Heart Rate A Mode: External Baseline Rate (A): 150 bpm Variability: Moderate Accelerations: 15 x 15 Decelerations: None Multiple birth?: No  Uterine Activity Mode: Palpation, Toco Contraction Frequency (min): 2-4 Contraction Duration (sec): 40-90 Contraction Quality: Mild Resting Tone Palpated: Relaxed Resting Time: Adequate  Interpretation (Fetal Testing) Nonstress Test Interpretation: Reactive Overall Impression: Reassuring for gestational age Comments: Dr. Darra Lis reviewed tracing

## 2021-11-10 ENCOUNTER — Encounter (HOSPITAL_COMMUNITY): Payer: Self-pay | Admitting: Family Medicine

## 2021-11-10 ENCOUNTER — Inpatient Hospital Stay (HOSPITAL_COMMUNITY)
Admission: AD | Admit: 2021-11-10 | Discharge: 2021-11-10 | Disposition: A | Discharge status: Home / Self Care | Payer: Medicaid Other | Attending: Family Medicine | Admitting: Family Medicine

## 2021-11-10 DIAGNOSIS — O471 False labor at or after 37 completed weeks of gestation: Secondary | ICD-10-CM

## 2021-11-10 DIAGNOSIS — Z3689 Encounter for other specified antenatal screening: Secondary | ICD-10-CM | POA: Diagnosis not present

## 2021-11-10 DIAGNOSIS — Z3A37 37 weeks gestation of pregnancy: Secondary | ICD-10-CM

## 2021-11-10 DIAGNOSIS — Z3480 Encounter for supervision of other normal pregnancy, unspecified trimester: Secondary | ICD-10-CM

## 2021-11-10 NOTE — MAU Provider Note (Signed)
S: Ms. Kylie Wallace is a 21 y.o. G2P1001 at [redacted]w[redacted]d  who presents to MAU today for labor evaluation.   After evaluation, nurse reports no cervical change and patient in no apparent distress.  Requests discharge. Strip reviewed.   Cervical exam by RN:  Dilation: 1 Effacement (%): Thick Cervical Position: Posterior Presentation: Undeterminable Exam by:: Ginnie Smart RN  Fetal Monitoring: Baseline: 135 Variability: Moderate Accelerations: Present Decelerations: Absent Contractions: Q1-54min  MDM Discussed patient with RN. NST reviewed.   A: SIUP at [redacted]w[redacted]d  Cat I FT False labor  P: NST Reactive Discharge home Labor precautions and kick counts included in AVS Patient to follow-up with primary office as scheduled  Patient may return to MAU as needed or when in labor   Gerrit Heck, [place] 11/10/2021 3:19 AM

## 2021-11-10 NOTE — MAU Note (Signed)
Pt says UC strong since [address] [facility]- with clinic VE- 2 weeks ago  closed  Denies HSV GBS- neg

## 2021-11-12 ENCOUNTER — Other Ambulatory Visit: Payer: Self-pay | Admitting: *Deleted

## 2021-11-12 ENCOUNTER — Encounter: Payer: Self-pay | Admitting: Family Medicine

## 2021-11-12 DIAGNOSIS — O99213 Obesity complicating pregnancy, third trimester: Secondary | ICD-10-CM

## 2021-11-12 DIAGNOSIS — O4103X0 Oligohydramnios, third trimester, not applicable or unspecified: Secondary | ICD-10-CM

## 2021-11-15 ENCOUNTER — Other Ambulatory Visit: Payer: Self-pay

## 2021-11-15 ENCOUNTER — Ambulatory Visit (INDEPENDENT_AMBULATORY_CARE_PROVIDER_SITE_OTHER): Payer: Medicaid Other | Admitting: Student

## 2021-11-15 VITALS — BP 123/84 | HR 89 | Wt 225.0 lb

## 2021-11-15 DIAGNOSIS — Z3A38 38 weeks gestation of pregnancy: Secondary | ICD-10-CM

## 2021-11-15 DIAGNOSIS — Z3480 Encounter for supervision of other normal pregnancy, unspecified trimester: Secondary | ICD-10-CM

## 2021-11-15 NOTE — Patient Instructions (Signed)
Signs and Symptoms of Labor Labor is the body's natural process of moving the baby and the placenta out of the uterus. The process of labor usually starts when the baby is full-term, between 39 and 41 weeks of pregnancy. Signs and symptoms that you are close to going into labor As your body prepares for labor and the birth of your baby, you may notice the following symptoms in the weeks and days before true labor starts: Passing a small amount of thick, bloody mucus from your vagina. This is called normal bloody show or losing your mucus plug. This may happen more than a week before labor begins, or right before labor begins, as the opening of the cervix starts to widen (dilate). For some women, the entire mucus plug passes at once. For others, pieces of the mucus plug may gradually pass over several days. Your baby moving (dropping) lower in your pelvis to get into position for birth (lightening). When this happens, you may feel more pressure on your bladder and pelvic bone and less pressure on your ribs. This may make it easier to breathe. It may also cause you to need to urinate more often and have problems with bowel movements. Having "practice contractions," also called Braxton Hicks contractions or false labor. These occur at irregular (unevenly spaced) intervals that are more than 10 minutes apart. False labor contractions are common after exercise or sexual activity. They will stop if you change position, rest, or drink fluids. These contractions are usually mild and do not get stronger over time. They may feel like: A backache or back pain. Mild cramps, similar to menstrual cramps. Tightening or pressure in your abdomen. Other early symptoms include: Nausea or loss of appetite. Diarrhea. Having a sudden burst of energy, or feeling very tired. Mood changes. Having trouble sleeping. Signs and symptoms that labor has begun Signs that you are in labor may include: Having contractions that come  at regular (evenly spaced) intervals and increase in intensity. This may feel like more intense tightening or pressure in your abdomen that moves to your back. Contractions may also feel like rhythmic pain in your upper thighs or back that comes and goes at regular intervals. If you are delivering for the first time, this change in intensity of contractions often occurs at a more gradual pace. If you have given birth before, you may notice a more rapid progression of contraction changes. Feeling pressure in the vaginal area. Your water breaking (rupture of membranes). This is when the sac of fluid that surrounds your baby breaks. Fluid leaking from your vagina may be clear or blood-tinged. Labor usually starts within 24 hours of your water breaking, but it may take longer to begin. Some people may feel a sudden gush of fluid; others may notice repeatedly damp underwear. Follow these instructions at home:  When labor starts, or if your water breaks, call your health care provider or nurse care line. Based on your situation, they will determine when you should go in for an exam. During early labor, you may be able to rest and manage symptoms at home. Some strategies to try at home include: Breathing and relaxation techniques. Taking a warm bath or shower. Listening to music. Using a heating pad on the lower back for pain. If directed, apply heat to the area as often as told by your health care provider. Use the heat source that your health care provider recommends, such as a moist heat pack or a heating pad. Place a   towel between your skin and the heat source. Leave the heat on for 20-30 minutes. Remove the heat if your skin turns bright red. This is especially important if you are unable to feel pain, heat, or cold. You have a greater risk of getting burned. Contact a health care provider if: Your labor has started. Your water breaks. You have nausea, vomiting, or diarrhea. Get help right away  if: You have painful, regular contractions that are 5 minutes apart or less. Labor starts before you are [redacted] weeks along in your pregnancy. You have a fever. You have bright red blood coming from your vagina. You do not feel your baby moving. You have a severe headache with or without vision problems. You have chest pain or shortness of breath. These symptoms may represent a serious problem that is an emergency. Do not wait to see if the symptoms will go away. Get medical help right away. Call your local emergency services (911 in the [place] Do not drive yourself to the hospital. Summary Labor is your body's natural process of moving your baby and the placenta out of your uterus. The process of labor usually starts when your baby is full-term, between 39 and 40 weeks of pregnancy. When labor starts, or if your water breaks, call your health care provider or nurse care line. Based on your situation, they will determine when you should go in for an exam. This information is not intended to replace advice given to you by your health care provider. Make sure you discuss any questions you have with your health care provider. Document Revised: 07/18/2020 Document Reviewed: 07/18/2020 Elsevier Patient Education  2023 Elsevier Inc.  

## 2021-11-15 NOTE — Progress Notes (Signed)
   PRENATAL VISIT NOTE  Subjective:  Kylie Wallace is a 21 y.o. G2P1001 at [redacted]w[redacted]d being seen today for ongoing prenatal care.  She is currently monitored for the following issues for this low-risk pregnancy and has Supervision of other normal pregnancy, antepartum on their problem list.  Patient reports no complaints other than ongoing contractions and some pelvic pressure. Contractions: Irritability. Vag. Bleeding: None.  Movement: Present. Denies leaking of fluid.   The following portions of the patient's history were reviewed and updated as appropriate: allergies, current medications, past family history, past medical history, past social history, past surgical history and problem list.   Objective:   Vitals:   11/15/21 1047  BP: 123/84  Pulse: 89  Weight: 225 lb (102.1 kg)    Fetal Status: Fetal Heart Rate (bpm): 147 Fundal Height: 39 cm Movement: Present     General:  Alert, oriented and cooperative. Patient is in no acute distress.  Skin: Skin is warm and dry. No rash noted.   Cardiovascular: Normal heart rate noted  Respiratory: Normal respiratory effort, no problems with respiration noted  Abdomen: Soft, gravid, appropriate for gestational age.  Pain/Pressure: Present     Pelvic: Cervical exam deferred        Extremities: Normal range of motion.     Mental Status: Normal mood and affect. Normal behavior. Normal judgment and thought content.   Assessment and Plan:  Pregnancy: G2P1001 at [redacted]w[redacted]d  1. [redacted] weeks gestation of pregnancy   2. Supervision of other normal pregnancy, antepartum   1. Supervision of other normal pregnancy, antepartum -doing well -patient is feeling strong movements.   Preterm labor symptoms and general obstetric precautions including but not limited to vaginal bleeding, contractions, leaking of fluid and fetal movement were reviewed in detail with the patient. Please refer to After Visit Summary for other counseling recommendations.   Return in  about 1 week (around 11/22/2021), or LROB in person.  Future Appointments  Date Time Provider Department Center  11/16/2021  3:15 PM [facility] NURSE [facility]  11/16/2021  3:30 PM [facility] US3 [facility]  11/23/2021  2:30 PM [facility] NURSE [facility]  11/23/2021  2:45 PM [facility] US7 [facility]    Samara Deist Gena Fray, CNM

## 2021-11-15 NOTE — Progress Notes (Signed)
Patient reports cramping like pain on lower back, hips and pelvis that started last Friday. She stated it comes and goes along with stomach getting tight

## 2021-11-16 ENCOUNTER — Ambulatory Visit: Payer: Medicaid Other | Attending: Maternal & Fetal Medicine

## 2021-11-16 ENCOUNTER — Ambulatory Visit: Payer: Medicaid Other | Admitting: *Deleted

## 2021-11-16 ENCOUNTER — Inpatient Hospital Stay (HOSPITAL_COMMUNITY)
Admission: AD | Admit: 2021-11-16 | Discharge: 2021-11-16 | Disposition: A | Discharge status: Home / Self Care | Payer: Medicaid Other | Attending: Obstetrics and Gynecology | Admitting: Obstetrics and Gynecology

## 2021-11-16 ENCOUNTER — Encounter: Payer: Self-pay | Admitting: *Deleted

## 2021-11-16 ENCOUNTER — Encounter (HOSPITAL_COMMUNITY): Payer: Self-pay | Admitting: Obstetrics and Gynecology

## 2021-11-16 VITALS — BP 121/81 | HR 88

## 2021-11-16 DIAGNOSIS — O4103X0 Oligohydramnios, third trimester, not applicable or unspecified: Secondary | ICD-10-CM | POA: Insufficient documentation

## 2021-11-16 DIAGNOSIS — Z3689 Encounter for other specified antenatal screening: Secondary | ICD-10-CM | POA: Diagnosis not present

## 2021-11-16 DIAGNOSIS — Z3A38 38 weeks gestation of pregnancy: Secondary | ICD-10-CM | POA: Diagnosis present

## 2021-11-16 DIAGNOSIS — Z3480 Encounter for supervision of other normal pregnancy, unspecified trimester: Secondary | ICD-10-CM | POA: Diagnosis present

## 2021-11-16 DIAGNOSIS — O99213 Obesity complicating pregnancy, third trimester: Secondary | ICD-10-CM | POA: Diagnosis present

## 2021-11-16 NOTE — MAU Note (Signed)
.  Kylie Wallace is a 21 y.o. at [redacted]w[redacted]d here in MAU reporting: sent from MFM for monitoring. 6/8 BPP. Good fetal movement reported having some mild ctx LMP:  Onset of complaint:  Pain score: 2 Vitals:   11/16/21 1836  BP: 126/79  Pulse: (!) 103  Resp: 18  Temp: 98 F (36.7 C)     FHT:155 Lab orders placed from triage:

## 2021-11-16 NOTE — MAU Provider Note (Incomplete)
History     CSN: [id number]  Arrival date and time: 11/16/21 1816   None     Chief Complaint  Patient presents with  . Non-stress Test   HPI Patient Kylie Wallace is a 21 y.o. G2P1001 at [redacted]w[redacted]d here for follow up NST after having a 6/8 BPP in the MFM office today.  (Patient had -2 points off of BPP for breathing, subjectively low fluid). She denies bleeding, vaginal discharge, decreased fetal movements. She was seen in clinic yesterday and reported contractions; those contractions have spaced out. She denies any other OB gyn complaints.   OB History     Gravida  2   Para  1   Term  1   Preterm  0   AB  0   Living  1      SAB  0   IAB  0   Ectopic  0   Multiple  0   Live Births  1           Past Medical History:  Diagnosis Date  . Medical history non-contributory     Past Surgical History:  Procedure Laterality Date  . WISDOM TOOTH EXTRACTION      Family History  Problem Relation Age of Onset  . Hypertension Mother   . Healthy Father   . Asthma Sister   . Diabetes Maternal Grandmother   . Diabetes Paternal Grandmother   . Birth defects Neg Hx   . Cancer Neg Hx   . Heart disease Neg Hx   . Stroke Neg Hx     Social History   Tobacco Use  . Smoking status: Never  . Smokeless tobacco: Never  Vaping Use  . Vaping Use: Never used  Substance Use Topics  . Alcohol use: No  . Drug use: No    Allergies: No Known Allergies  Medications Prior to Admission  Medication Sig Dispense Refill Last Dose  . Prenatal Vit-Fe Fumarate-FA (PRENATAL PO) Take by mouth.   11/16/2021  . terconazole (TERAZOL 7) 0.4 % vaginal cream Place 1 applicator vaginally at bedtime. (Patient not taking: Reported on 11/16/2021) 45 g 0 Not Taking    Review of Systems  Constitutional: Negative.   HENT: Negative.    Respiratory: Negative.    Cardiovascular: Negative.   Genitourinary: Negative.   Hematological: Negative.    Physical Exam   Blood pressure 126/79, pulse  (!) 103, temperature 98 F (36.7 C), resp. rate 18, height 5\' 4"  (1.626 m), weight 102.5 kg, last menstrual period 02/18/2021.  Physical Exam Constitutional:      Appearance: Normal appearance.  Cardiovascular:     Rate and Rhythm: Normal rate.  Pulmonary:     Effort: Pulmonary effort is normal.  Musculoskeletal:        General: Normal range of motion.  Skin:    General: Skin is warm.  Neurological:     General: No focal deficit present.     Mental Status: She is alert.  Psychiatric:        Mood and Affect: Mood normal.    MAU Course  Procedures  MDM -NST: 130 bpm, mod var, present acel, no decels, no contractions.  -Reviewed MFM note, which recommended IOL closer to 39 weeks; discussed low fluid and MFM recommendation, patient would like to be induced at 39 weeks (on 11/18/2021).  Assessment and Plan   1. [redacted] weeks gestation of pregnancy   2. NST (non-stress test) reactive   -patient scheduled for IOL in less  than 48 hours; she knows she will be called and will come to hospital within two hours.  -reviewed warning signs and when to come to MAU -IOL orders placed  Charlesetta Garibaldi Shantil Vallejo 11/16/2021, 7:25 PM

## 2021-11-17 ENCOUNTER — Other Ambulatory Visit: Payer: Self-pay | Admitting: Advanced Practice Midwife

## 2021-11-17 ENCOUNTER — Other Ambulatory Visit: Payer: Self-pay | Admitting: Student

## 2021-11-17 DIAGNOSIS — O4103X0 Oligohydramnios, third trimester, not applicable or unspecified: Secondary | ICD-10-CM

## 2021-11-18 ENCOUNTER — Emergency Department (HOSPITAL_BASED_OUTPATIENT_CLINIC_OR_DEPARTMENT_OTHER)
Admission: EM | Admit: 2021-11-18 | Discharge: 2021-11-18 | Disposition: A | Discharge status: Home / Self Care | Payer: Medicaid Other | Source: Home / Self Care | Attending: Emergency Medicine | Admitting: Emergency Medicine

## 2021-11-18 ENCOUNTER — Inpatient Hospital Stay (HOSPITAL_COMMUNITY)
Admission: AD | Admit: 2021-11-18 | Discharge: 2021-11-18 | Disposition: A | Discharge status: Home / Self Care | Payer: Medicaid Other | Attending: Obstetrics & Gynecology | Admitting: Obstetrics & Gynecology

## 2021-11-18 ENCOUNTER — Other Ambulatory Visit: Payer: Self-pay

## 2021-11-18 ENCOUNTER — Encounter (HOSPITAL_BASED_OUTPATIENT_CLINIC_OR_DEPARTMENT_OTHER): Payer: Self-pay

## 2021-11-18 ENCOUNTER — Inpatient Hospital Stay (HOSPITAL_COMMUNITY): Payer: Medicaid Other

## 2021-11-18 DIAGNOSIS — O26893 Other specified pregnancy related conditions, third trimester: Secondary | ICD-10-CM | POA: Insufficient documentation

## 2021-11-18 DIAGNOSIS — Z3490 Encounter for supervision of normal pregnancy, unspecified, unspecified trimester: Secondary | ICD-10-CM

## 2021-11-18 DIAGNOSIS — H6501 Acute serous otitis media, right ear: Secondary | ICD-10-CM

## 2021-11-18 DIAGNOSIS — Z3A39 39 weeks gestation of pregnancy: Secondary | ICD-10-CM | POA: Insufficient documentation

## 2021-11-18 MED ORDER — ACETAMINOPHEN 325 MG PO TABS
650.0000 mg | ORAL_TABLET | Freq: Once | ORAL | Status: AC
  Administered 2021-11-18: 650 mg via ORAL
  Filled 2021-11-18: qty 2

## 2021-11-18 MED ORDER — LACTATED RINGERS IV BOLUS
1000.0000 mL | Freq: Once | INTRAVENOUS | Status: AC
  Administered 2021-11-18: 1000 mL via INTRAVENOUS

## 2021-11-18 MED ORDER — AMOXICILLIN 500 MG PO CAPS: 500.0000 mg | ORAL_CAPSULE | Freq: Three times a day (TID) | ORAL | 0 refills | Status: DC

## 2021-11-18 MED ORDER — AMOXICILLIN 500 MG PO CAPS
500.0000 mg | ORAL_CAPSULE | Freq: Once | ORAL | Status: AC
  Administered 2021-11-18: 500 mg via ORAL
  Filled 2021-11-18: qty 1

## 2021-11-18 NOTE — ED Provider Notes (Signed)
[facility] EMERGENCY DEPT Provider Note   CSN: [id number] Arrival date & time: 11/18/21  1822     History {Add pertinent medical, surgical, social history, OB history to HPI:1} Chief Complaint  Patient presents with   Otalgia    Kylie Wallace is a 21 y.o. female.  She is [redacted] weeks pregnant.  She is complaining of right ear pain that started this morning.  She has tried nothing for it.  No traumas.  No headache no abdominal pain.  She was was to get admission for induction today but apparently the hospital was full so she is on standby.  She has not broken her water there is been no fever.  The history is provided by the patient and the spouse.  Otalgia Location:  Right Behind ear:  No abnormality Quality:  Throbbing Severity:  Severe Onset quality:  Gradual Duration:  1 day Timing:  Constant Progression:  Unchanged Chronicity:  New Relieved by:  None tried Worsened by:  Nothing Ineffective treatments:  None tried Associated symptoms: no abdominal pain, no cough, no fever, no headaches, no sore throat and no vomiting        Home Medications Prior to Admission medications   Medication Sig Start Date End Date Taking? Authorizing Provider  Prenatal Vit-Fe Fumarate-FA (PRENATAL PO) Take by mouth.    [provider]  norethindrone (MICRONOR) 0.35 MG tablet Take 1 tablet (0.35 mg total) by mouth daily. 11/10/18 01/04/19  Dorathy Kinsman, CNM      Allergies    Patient has no known allergies.    Review of Systems   Review of Systems  Constitutional:  Negative for fever.  HENT:  Positive for ear pain. Negative for sore throat.   Eyes:  Negative for visual disturbance.  Respiratory:  Negative for cough.   Gastrointestinal:  Negative for abdominal pain and vomiting.  Neurological:  Negative for headaches.    Physical Exam Updated Vital Signs BP (!) 142/91   Pulse 90   Temp 99.5 F (37.5 C) (Oral)   Resp 18   LMP 02/18/2021   SpO2 99%   Physical Exam Vitals and nursing note reviewed.  Constitutional:      General: She is not in acute distress.    Appearance: Normal appearance. She is well-developed.  HENT:     Head: Normocephalic and atraumatic.  Eyes:     Conjunctiva/sclera: Conjunctivae normal.  Cardiovascular:     Rate and Rhythm: Normal rate and regular rhythm.     Heart sounds: No murmur heard. Pulmonary:     Effort: Pulmonary effort is normal. No respiratory distress.     Breath sounds: Normal breath sounds.  Abdominal:     Palpations: Abdomen is soft. There is mass.     Tenderness: There is no abdominal tenderness. There is no guarding or rebound.  Musculoskeletal:        General: Normal range of motion.     Cervical back: Neck supple.     Right lower leg: No edema.     Left lower leg: No edema.  Skin:    General: Skin is warm and dry.     Capillary Refill: Capillary refill takes less than 2 seconds.  Neurological:     General: No focal deficit present.     Mental Status: She is alert.     ED Results / Procedures / Treatments   Labs (all labs ordered are listed, but only abnormal results are displayed) Labs Reviewed - No data to display  EKG None  Radiology No results found.  Procedures Procedures  {Document cardiac monitor, telemetry assessment procedure when appropriate:1}  Medications Ordered in ED Medications  acetaminophen (TYLENOL) tablet 650 mg (has no administration in time range)  amoxicillin (AMOXIL) capsule 500 mg (has no administration in time range)    ED Course/ Medical Decision Making/ A&P                           Medical Decision Making Risk OTC drugs. Prescription drug management.   ***  {Document critical care time when appropriate:1} {Document review of labs and clinical decision tools ie heart score, Chads2Vasc2 etc:1}  {Document your independent review of radiology images, and any outside records:1} {Document your discussion with family members,  caretakers, and with consultants:1} {Document social determinants of health affecting pt's care:1} {Document your decision making why or why not admission, treatments were needed:1} Final Clinical Impression(s) / ED Diagnoses Final diagnoses:  None    Rx / DC Orders ED Discharge Orders     None

## 2021-11-18 NOTE — ED Notes (Signed)
Previous Triage Assessment and Notation was not completed by this RN and was completed by Ace Gins, RN.

## 2021-11-18 NOTE — Progress Notes (Signed)
Spoke with Dr Leanora Cover regarding FHR tracing.  Received orders for 1 liter bolus LR.

## 2021-11-18 NOTE — Discharge Instructions (Addendum)
Please go over to [facility] for evaluation in labor and delivery.  They can recheck your ear there and see if he need to continue on antibiotics.

## 2021-11-18 NOTE — ED Triage Notes (Signed)
Right ear severe pain upon waking today. No recent illness or fevers.

## 2021-11-18 NOTE — ED Notes (Addendum)
Call made to OB rapid response at this time r/t FHR. FHR at this time 168.

## 2021-11-18 NOTE — ED Triage Notes (Signed)
Pt is [redacted]  weeks pregnant, is scheduled for induction today but hospital is full. She is being induced d/t low amniotic fluid. No problems with her pregnancy

## 2021-11-18 NOTE — ED Notes (Signed)
Called OB rapid response nurse and spoke with Meriam Sprague, Charity fundraiser. Meriam Sprague, RN reports improvement in fetal monitoring assessment. Cleared to be discharged to drive POV to [facility]. FHR 145 at this time.

## 2021-11-18 NOTE — ED Notes (Signed)
Pt verbalizes understanding of discharge instructions. Opportunity for questioning and answers were provided. Pt discharged from ED. Instructed to go straight to [facility] center for admission for induction. IV left in per VO of MD. Pt reports continued, but not worsening, abdominal cramping of 2/10 pain. Ambulatory from ED.

## 2021-11-18 NOTE — ED Notes (Signed)
Pt is laying on her left side. Fetal heart rate is 167. LR bolus in process.

## 2021-11-18 NOTE — Progress Notes (Signed)
Spoke with Dr Charm Barges at [facility].  Advised of Dr Tarry Kos orders for 1 liter bolus LR and further evaluation of FHR tracing prior to discharge.  Will continue to monitor and RN will call when bolus complete. Also advised that patient may need to be transferred via EMS to [facility] if no improvement in FHR tracing.

## 2021-11-18 NOTE — ED Notes (Addendum)
FHR 170. MD notified.

## 2021-11-18 NOTE — ED Notes (Signed)
Pt remaining on monitor at this time. Reports continued 2/10 abdominal cramping.

## 2021-11-18 NOTE — ED Notes (Signed)
After further consultation with ED MD decision was made to place pt on toco monitor. Pt moved to [facility] for toco monitoring at this time.

## 2021-11-18 NOTE — ED Notes (Addendum)
B[place]ly with rapid response called and asked that we administer a liter bolus of LR per recommendations by their MD and re-evaluate after the bolus if pt can drive pov to mau or will need to be taken by EMS to [facility]. Dr. Charm Barges spoke with [place] as well about these instructions. Pt continues to be monitored on the fetal heart monitor. Fetal HR is 177-185 at present. Pt's heart rate 99. 02 sat 99% on RA.

## 2021-11-18 NOTE — ED Notes (Addendum)
Per rapid response nurse Meriam Sprague. Pt is cleared to be discharged from this facility and go directly to the [facility] for induction. MD notified.

## 2021-11-18 NOTE — ED Notes (Signed)
Spoke to MD butler about pt complaint and acuity. Sts that she does not need cont fetal monitoring at this time and can go into standard ED room.

## 2021-11-19 ENCOUNTER — Encounter (HOSPITAL_COMMUNITY): Payer: Self-pay | Admitting: Anesthesiology

## 2021-11-19 ENCOUNTER — Encounter (HOSPITAL_COMMUNITY): Payer: Self-pay | Admitting: Obstetrics & Gynecology

## 2021-11-19 ENCOUNTER — Inpatient Hospital Stay (HOSPITAL_COMMUNITY)
Admission: AD | Admit: 2021-11-19 | Discharge: 2021-11-21 | DRG: 806 | Disposition: A | Discharge status: Home / Self Care | Payer: Medicaid Other | Attending: Obstetrics & Gynecology | Admitting: Obstetrics & Gynecology

## 2021-11-19 DIAGNOSIS — H6501 Acute serous otitis media, right ear: Secondary | ICD-10-CM | POA: Diagnosis present

## 2021-11-19 DIAGNOSIS — O4103X0 Oligohydramnios, third trimester, not applicable or unspecified: Secondary | ICD-10-CM | POA: Diagnosis present

## 2021-11-19 DIAGNOSIS — O4100X0 Oligohydramnios, unspecified trimester, not applicable or unspecified: Secondary | ICD-10-CM | POA: Diagnosis present

## 2021-11-19 DIAGNOSIS — Z3A39 39 weeks gestation of pregnancy: Secondary | ICD-10-CM | POA: Diagnosis not present

## 2021-11-19 DIAGNOSIS — O99892 Other specified diseases and conditions complicating childbirth: Secondary | ICD-10-CM | POA: Diagnosis present

## 2021-11-19 LAB — CBC
HCT: 34.7 % — ABNORMAL LOW (ref 36.0–46.0)
HCT: 33.2 % — ABNORMAL LOW (ref 36.0–46.0)
Hemoglobin: 10.8 g/dL — ABNORMAL LOW (ref 12.0–15.0)
Hemoglobin: 10.8 g/dL — ABNORMAL LOW (ref 12.0–15.0)
MCH: 26.9 pg (ref 26.0–34.0)
MCH: 27.5 pg (ref 26.0–34.0)
MCHC: 31.1 g/dL (ref 30.0–36.0)
MCHC: 32.5 g/dL (ref 30.0–36.0)
MCV: 86.3 fL (ref 80.0–100.0)
MCV: 84.5 fL (ref 80.0–100.0)
Platelets: 176 10*3/uL (ref 150–400)
Platelets: 177 10*3/uL (ref 150–400)
RBC: 4.02 MIL/uL (ref 3.87–5.11)
RBC: 3.93 MIL/uL (ref 3.87–5.11)
RDW: 17.2 % — ABNORMAL HIGH (ref 11.5–15.5)
RDW: 17.2 % — ABNORMAL HIGH (ref 11.5–15.5)
WBC: 12.1 10*3/uL — ABNORMAL HIGH (ref 4.0–10.5)
WBC: 17.8 10*3/uL — ABNORMAL HIGH (ref 4.0–10.5)
nRBC: 0 % (ref 0.0–0.2)
nRBC: 0 % (ref 0.0–0.2)

## 2021-11-19 LAB — RPR: RPR Ser Ql: NONREACTIVE

## 2021-11-19 MED ORDER — MISOPROSTOL 25 MCG QUARTER TABLET
25.0000 ug | ORAL_TABLET | Freq: Once | ORAL | Status: AC
  Administered 2021-11-19: 25 ug via VAGINAL
  Filled 2021-11-19: qty 1

## 2021-11-19 MED ORDER — MEASLES, MUMPS & RUBELLA VAC IJ SOLR: 0.5000 mL | Freq: Once | INTRAMUSCULAR | Status: DC

## 2021-11-19 MED ORDER — ACETAMINOPHEN 500 MG PO TABS
1000.0000 mg | ORAL_TABLET | Freq: Four times a day (QID) | ORAL | Status: DC | PRN
  Administered 2021-11-19: 1000 mg via ORAL
  Filled 2021-11-19 (×2): qty 2

## 2021-11-19 MED ORDER — MISOPROSTOL 50MCG HALF TABLET: 50.0000 ug | ORAL_TABLET | Freq: Once | ORAL | Status: DC

## 2021-11-19 MED ORDER — MISOPROSTOL 200 MCG PO TABS: 400.0000 ug | ORAL_TABLET | Freq: Once | ORAL | Status: DC

## 2021-11-19 MED ORDER — WITCH HAZEL-GLYCERIN EX PADS: 1.0000 | MEDICATED_PAD | CUTANEOUS | Status: DC | PRN

## 2021-11-19 MED ORDER — SIMETHICONE 80 MG PO CHEW: 80.0000 mg | CHEWABLE_TABLET | ORAL | Status: DC | PRN

## 2021-11-19 MED ORDER — METHYLERGONOVINE MALEATE 0.2 MG/ML IJ SOLN: 0.2000 mg | Freq: Once | INTRAMUSCULAR | Status: DC

## 2021-11-19 MED ORDER — DIBUCAINE (PERIANAL) 1 % EX OINT: 1.0000 | TOPICAL_OINTMENT | CUTANEOUS | Status: DC | PRN

## 2021-11-19 MED ORDER — OXYTOCIN BOLUS FROM INFUSION
333.0000 mL | Freq: Once | INTRAVENOUS | Status: AC
  Administered 2021-11-19: 333 mL via INTRAVENOUS

## 2021-11-19 MED ORDER — OXYTOCIN-SODIUM CHLORIDE 30-0.9 UT/500ML-% IV SOLN: 2.5000 [IU]/h | INTRAVENOUS | Status: DC

## 2021-11-19 MED ORDER — ONDANSETRON HCL 4 MG/2ML IJ SOLN: 4.0000 mg | Freq: Four times a day (QID) | INTRAMUSCULAR | Status: DC | PRN

## 2021-11-19 MED ORDER — SODIUM CHLORIDE 0.9% FLUSH
3.0000 mL | Freq: Two times a day (BID) | INTRAVENOUS | Status: DC
  Administered 2021-11-19: 3 mL via INTRAVENOUS

## 2021-11-19 MED ORDER — FENTANYL CITRATE (PF) 100 MCG/2ML IJ SOLN: 100.0000 ug | INTRAMUSCULAR | Status: DC | PRN

## 2021-11-19 MED ORDER — ZOLPIDEM TARTRATE 5 MG PO TABS: 5.0000 mg | ORAL_TABLET | Freq: Every evening | ORAL | Status: DC | PRN

## 2021-11-19 MED ORDER — MISOPROSTOL 200 MCG PO TABS
400.0000 ug | ORAL_TABLET | Freq: Once | ORAL | Status: AC
Start: 2021-11-19 — End: 2021-11-19
  Administered 2021-11-19: 400 ug via BUCCAL

## 2021-11-19 MED ORDER — OXYTOCIN-SODIUM CHLORIDE 30-0.9 UT/500ML-% IV SOLN
1.0000 m[IU]/min | INTRAVENOUS | Status: DC
  Administered 2021-11-19: 2 m[IU]/min via INTRAVENOUS
  Filled 2021-11-19: qty 500

## 2021-11-19 MED ORDER — SODIUM CHLORIDE 0.9% FLUSH: 3.0000 mL | INTRAVENOUS | Status: DC | PRN

## 2021-11-19 MED ORDER — TETANUS-DIPHTH-ACELL PERTUSSIS 5-2.5-18.5 LF-MCG/0.5 IM SUSY: 0.5000 mL | PREFILLED_SYRINGE | Freq: Once | INTRAMUSCULAR | Status: DC

## 2021-11-19 MED ORDER — TRANEXAMIC ACID-NACL 1000-0.7 MG/100ML-% IV SOLN
1000.0000 mg | INTRAVENOUS | Status: AC
  Administered 2021-11-19: 1000 mg via INTRAVENOUS

## 2021-11-19 MED ORDER — IBUPROFEN 600 MG PO TABS
600.0000 mg | ORAL_TABLET | Freq: Four times a day (QID) | ORAL | Status: DC
  Administered 2021-11-19 – 2021-11-21 (×6): 600 mg via ORAL
  Filled 2021-11-19 (×8): qty 1

## 2021-11-19 MED ORDER — PRENATAL MULTIVITAMIN CH
1.0000 | ORAL_TABLET | Freq: Every day | ORAL | Status: DC
  Administered 2021-11-20 – 2021-11-21 (×2): 1 via ORAL
  Filled 2021-11-19 (×2): qty 1

## 2021-11-19 MED ORDER — TERBUTALINE SULFATE 1 MG/ML IJ SOLN: 0.2500 mg | Freq: Once | INTRAMUSCULAR | Status: DC | PRN

## 2021-11-19 MED ORDER — BISACODYL 10 MG RE SUPP: 10.0000 mg | Freq: Every day | RECTAL | Status: DC | PRN

## 2021-11-19 MED ORDER — ACETAMINOPHEN 325 MG PO TABS: 650.0000 mg | ORAL_TABLET | ORAL | Status: DC | PRN

## 2021-11-19 MED ORDER — TRANEXAMIC ACID-NACL 1000-0.7 MG/100ML-% IV SOLN: 1000.0000 mg | Freq: Once | INTRAVENOUS | Status: DC | PRN

## 2021-11-19 MED ORDER — BENZOCAINE-MENTHOL 20-0.5 % EX AERO: 1.0000 | INHALATION_SPRAY | CUTANEOUS | Status: DC | PRN

## 2021-11-19 MED ORDER — ONDANSETRON HCL 4 MG/2ML IJ SOLN: 4.0000 mg | INTRAMUSCULAR | Status: DC | PRN

## 2021-11-19 MED ORDER — TRANEXAMIC ACID-NACL 1000-0.7 MG/100ML-% IV SOLN
INTRAVENOUS | Status: AC
  Filled 2021-11-19: qty 100

## 2021-11-19 MED ORDER — SODIUM CHLORIDE 0.9 % IV SOLN
250.0000 mL | INTRAVENOUS | Status: DC | PRN
  Administered 2021-11-19: 250 mL via INTRAVENOUS

## 2021-11-19 MED ORDER — CEFAZOLIN SODIUM-DEXTROSE 2-4 GM/100ML-% IV SOLN
2.0000 g | Freq: Once | INTRAVENOUS | Status: AC
  Administered 2021-11-19: 2 g via INTRAVENOUS
  Filled 2021-11-19: qty 100

## 2021-11-19 MED ORDER — LIDOCAINE HCL (PF) 1 % IJ SOLN: 30.0000 mL | INTRAMUSCULAR | Status: DC | PRN

## 2021-11-19 MED ORDER — COCONUT OIL OIL: 1.0000 | TOPICAL_OIL | Status: DC | PRN

## 2021-11-19 MED ORDER — LACTATED RINGERS IV SOLN: INTRAVENOUS | Status: DC

## 2021-11-19 MED ORDER — MISOPROSTOL 200 MCG PO TABS
ORAL_TABLET | ORAL | Status: AC
  Administered 2021-11-19: 400 ug
  Filled 2021-11-19: qty 4

## 2021-11-19 MED ORDER — OXYCODONE-ACETAMINOPHEN 5-325 MG PO TABS
2.0000 | ORAL_TABLET | ORAL | Status: DC | PRN
  Administered 2021-11-19: 2 via ORAL
  Filled 2021-11-19: qty 2

## 2021-11-19 MED ORDER — FLEET ENEMA 7-19 GM/118ML RE ENEM: 1.0000 | ENEMA | Freq: Every day | RECTAL | Status: DC | PRN

## 2021-11-19 MED ORDER — ONDANSETRON HCL 4 MG PO TABS: 4.0000 mg | ORAL_TABLET | ORAL | Status: DC | PRN

## 2021-11-19 MED ORDER — AMOXICILLIN 500 MG PO CAPS
500.0000 mg | ORAL_CAPSULE | Freq: Three times a day (TID) | ORAL | Status: DC
Start: 2021-11-19 — End: 2021-11-19
  Administered 2021-11-19 (×2): 500 mg via ORAL
  Filled 2021-11-19 (×4): qty 1

## 2021-11-19 MED ORDER — DIPHENHYDRAMINE HCL 25 MG PO CAPS: 25.0000 mg | ORAL_CAPSULE | Freq: Four times a day (QID) | ORAL | Status: DC | PRN

## 2021-11-19 MED ORDER — LACTATED RINGERS IV SOLN: 500.0000 mL | INTRAVENOUS | Status: DC | PRN

## 2021-11-19 MED ORDER — SENNOSIDES-DOCUSATE SODIUM 8.6-50 MG PO TABS
2.0000 | ORAL_TABLET | Freq: Every day | ORAL | Status: DC
  Administered 2021-11-20 – 2021-11-21 (×2): 2 via ORAL
  Filled 2021-11-19 (×2): qty 2

## 2021-11-19 MED ORDER — ACETAMINOPHEN 325 MG PO TABS
650.0000 mg | ORAL_TABLET | ORAL | Status: DC | PRN
  Administered 2021-11-21: 650 mg via ORAL
  Filled 2021-11-19: qty 2

## 2021-11-19 MED ORDER — OXYCODONE-ACETAMINOPHEN 5-325 MG PO TABS
1.0000 | ORAL_TABLET | ORAL | Status: DC | PRN
  Administered 2021-11-19: 1 via ORAL
  Filled 2021-11-19: qty 1

## 2021-11-19 MED ORDER — METHYLERGONOVINE MALEATE 0.2 MG/ML IJ SOLN
INTRAMUSCULAR | Status: AC
  Administered 2021-11-19: 0.2 mg via INTRAMUSCULAR
  Filled 2021-11-19: qty 1

## 2021-11-19 MED ORDER — AMOXICILLIN 500 MG PO CAPS
500.0000 mg | ORAL_CAPSULE | Freq: Three times a day (TID) | ORAL | Status: DC
  Administered 2021-11-19 – 2021-11-21 (×5): 500 mg via ORAL
  Filled 2021-11-19 (×7): qty 1

## 2021-11-19 MED ORDER — SOD CITRATE-CITRIC ACID 500-334 MG/5ML PO SOLN: 30.0000 mL | ORAL | Status: DC | PRN

## 2021-11-19 NOTE — Discharge Summary (Shared)
Postpartum Discharge Summary  Date of Service updated***     Patient Name: Kylie Wallace DOB: 2000/05/24 MRN: [id number]  Date of admission: 11/19/2021 Delivery date:11/19/2021  Delivering provider:   Date of discharge: 11/19/2021  Admitting diagnosis: Oligohydramnios [O41.00X0] Intrauterine pregnancy: [redacted]w[redacted]d    Secondary diagnosis:  Principal Problem:   Oligohydramnios  Additional problems: otitis media, PPH    Discharge diagnosis: Term Pregnancy Delivered and PPH                                              Post partum procedures:{Postpartum procedures:23558} Augmentation: AROM, Pitocin, and Cytotec Complications: [id number]>2111NB Hospital course: Induction of Labor With Vaginal Delivery   21y.o. yo G2P1001 at 327w1das admitted to the hospital 11/19/2021 for induction of labor.  Indication for induction:  oligohydramnios .  Patient had an uncomplicated labor course as follows: Membrane Rupture Time/Date: 10:55 AM ,11/19/2021   Delivery Method:  Episiotomy:   Lacerations:    Details of delivery can be found in separate delivery note.  Patient had a routine postpartum course. Patient is discharged home 11/19/21.  Newborn Data: Birth date:11/19/2021  Birth time:5:43 PM  Gender:Female  Living status:Living  Apgars: ,  Weight:3925 g   Magnesium Sulfate received: No BMZ received: No Rhophylac:N/A MMR:N/A T-DaP:Given prenatally Flu: No Transfusion:{Transfusion received:30440034}  Physical exam  Vitals:   11/19/21 1600 11/19/21 1630 11/19/21 1700 11/19/21 1721  BP: 114/60 121/66 117/68   Pulse: 87 95 90   Resp:      Temp:    99.9 F (37.7 C)  TempSrc:    Axillary  SpO2:      Weight:      Height:       General: {Exam; general:21111117} Lochia: {Desc; appropriate/inappropriate:30686::"appropriate"} Uterine Fundus: {Desc; firm/soft:30687} Incision: {Exam; incision:21111123} DVT Evaluation: {Exam; dvt:2111122} Labs: Lab Results  Component Value Date   WBC 12.1  (H) 11/19/2021   HGB 10.8 (L) 11/19/2021   HCT 34.7 (L) 11/19/2021   MCV 86.3 11/19/2021   PLT 176 11/19/2021      Latest Ref Rng & Units 05/09/2021   11:29 AM  CMP  Glucose 70 - 99 mg/dL 88   BUN 6 - 20 mg/dL 8   Creatinine 0.57 - 1.00 mg/dL 0.75   Sodium 134 - 144 mmol/L 134   Potassium 3.5 - 5.2 mmol/L 4.3   Chloride 96 - 106 mmol/L 100   CO2 20 - 29 mmol/L 20   Calcium 8.7 - 10.2 mg/dL 9.7   Total Protein 6.0 - 8.5 g/dL 7.2   Total Bilirubin 0.0 - 1.2 mg/dL 0.3   Alkaline Phos 44 - 121 IU/L 68   AST 0 - 40 IU/L 20   ALT 0 - 32 IU/L 24    Edinburgh Score:    11/10/2018   10:42 AM  Edinburgh Postnatal Depression Scale Screening Tool  I have been able to laugh and see the funny side of things. 0  I have looked forward with enjoyment to things. 0  I have blamed myself unnecessarily when things went wrong. 0  I have been anxious or worried for no good reason. 0  I have felt scared or panicky for no good reason. 0  Things have been getting on top of me. 0  I have been so unhappy that I have had difficulty sleeping. 0  I  have felt sad or miserable. 0  I have been so unhappy that I have been crying. 0  The thought of harming myself has occurred to me. 0  Edinburgh Postnatal Depression Scale Total 0     After visit meds:  Allergies as of 11/19/2021   No Known Allergies   Med Rec must be completed prior to using this [facility]***        Discharge home in stable condition Infant Feeding: {Baby feeding:23562} Infant Disposition:{CHL IP OB HOME WITH HRCBUL:84536} Discharge instruction: per After Visit Summary and Postpartum booklet. Activity: Advance as tolerated. Pelvic rest for 6 weeks.  Diet: {OB IWOE:32122482} Future Appointments: Future Appointments  Date Time Provider [place]  11/22/2021 11:15 AM Chancy Milroy, MD [facility]   Follow up Visit: Roma Schanz, CNM  P [facility] Admin Pool Please schedule this patient for PP visit in: 4-6wks   High risk pregnancy complicated by: oligohydramnios  Delivery mode:  SVD  Anticipated Birth Control:  POPs  PP Procedures needed: none  Schedule Integrated BH visit: no  Provider: Any provider   11/19/2021 Roma Schanz, CNM

## 2021-11-19 NOTE — Lactation Note (Signed)
This note was copied from a baby's chart. Lactation Consultation Note  Patient Name: Girl Lujuana Kapler HWEXH'B Date: 11/19/2021 Reason for consult: L&D Initial assessment Age:23 hours  P2, Baby cueing but congested.  Attempted to latch but at this time baby was not able to sustain latch due to congestion and breathing coordination. Lactation to follow up on MBU.  Maternal Data Does the patient have breastfeeding experience prior to this delivery?: Yes  Feeding Mother's Current Feeding Choice: Breast Milk  LATCH Score Latch: Repeated attempts needed to sustain latch, nipple held in mouth throughout feeding, stimulation needed to elicit sucking reflex.  Audible Swallowing: None  Type of Nipple: Everted at rest and after stimulation  Comfort (Breast/Nipple): Soft / non-tender  Hold (Positioning): Assistance needed to correctly position infant at breast and maintain latch.  LATCH Score: 6   Interventions Interventions: Assisted with latch;Skin to skin;Education  Consult Status Consult Status: Follow-up from L&D    Dahlia Byes [facility] 11/19/2021, 6:34 PM

## 2021-11-19 NOTE — Progress Notes (Signed)
Spoke with Neurosurgeon.  FHR 155 with +Accelerations, no decelerations and no uterine activity.  Advised that per Dr Leanora Cover patient may drive self to [facility] for IOL.

## 2021-11-19 NOTE — Progress Notes (Signed)
Patient ID: Kylie Wallace, female   DOB: 05-Jun-2000, 21 y.o.   MRN: [id number] Kylie Wallace is a 21 y.o. G2P1001 at [redacted]w[redacted]d admitted for induction of labor due to oligohydramnios  Subjective: feeling contractions some, but not uncomfortable, ear hurts  Objective: 99.1, 89, 17, 114/75  FHR baseline 155 bpm, Variability: moderate, Accelerations:present, Decelerations:  Present  occ mild variable Toco: q 2-5 mins   SVE:   Dilation: 6 Effacement (%): 70 Station: -2 Exam by:: Barry Dienes, RN  Pitocin @ 14 mu/min  Labs: Lab Results  Component Value Date   WBC 12.1 (H) 11/19/2021   HGB 10.8 (L) 11/19/2021   HCT 34.7 (L) 11/19/2021   MCV 86.3 11/19/2021   PLT 176 11/19/2021    Assessment / Plan: IOL d/t oligo, s/p cytotec x 1 (0057), pit @ 14, AROM'd at 1055  Labor: active Fetal Wellbeing:  Category II Pain Control:  labor support without medications Pre-eclampsia: N/A I/D:  GBS neg Amoxicillin for otitis Anticipated MOD: NSVB  Cheral Marker CNM, WHNP-BC 11/19/2021, [id number]

## 2021-11-19 NOTE — Plan of Care (Signed)

## 2021-11-19 NOTE — Lactation Note (Signed)
This note was copied from a baby's chart. Lactation Consultation Note  Patient Name: Kylie Wallace [id number] Date: 11/19/2021 Reason for consult: Initial assessment;Term Age:23 hours Birth Parent is experienced at breastfeeding see maternal data below. Birth Parent feeding choice is : breast and bottle feeding. Per Birth Parent, she is offering formula, infant is not interested in latching at breast and has been congested. LC did not observe latch infant recently was given 15 mls of formula. Birth Parent is tired, plans to rest and will later attempt to latch infant to  breast at the next feeding and if infant doesn't latch Birth Parent  is planning on offering infant  formula.  Birth Parent will continue to BF infant according to hunger cues, 8 to 12 x within 24 hrs, STS. Birth Parent wants to be set up with DEBP in morning if infant continues not to latch at the breast.  LC discussed importance of maternal rest, diet and hydration to Birth Parent. Birth Parent was made aware of O/P services, breastfeeding support groups, community resources, and our phone # for post-discharge questions.   Maternal Data Has patient been taught Hand Expression?: Yes Does the patient have breastfeeding experience prior to this delivery?: Yes How long did the patient breastfeed?: Per Birth Parent, she BF 1st child for 2 years, 1st child is now 25 years old.  Feeding Mother's Current Feeding Choice: Breast Milk and Formula  LATCH Score                    Lactation Tools Discussed/Used    Interventions Interventions: Breast feeding basics reviewed;Skin to skin;Education;Pace feeding;LC Services brochure  Discharge WIC Program: Yes  Consult Status Consult Status: Follow-up Date: 11/20/21 Follow-up type: In-patient    Danelle Earthly 11/19/2021, 10:29 PM

## 2021-11-19 NOTE — Progress Notes (Signed)
Patient ID: Kylie Wallace, female   DOB: 04-29-2000, 21 y.o.   MRN: [id number] Doing well  Vitals:   11/19/21 0022 11/19/21 0234 11/19/21 0420 11/19/21 0426  BP: 126/68 111/63 136/86   Pulse: 96 91 92   Resp: 16     Temp: 98.8 F (37.1 C)   98.7 F (37.1 C)  TempSrc: Oral   Oral  SpO2: 100%     Weight:      Height:       FHR stable UCs irregular  Dilation: 3.5 Effacement (%): 50 Station: -2 Presentation: Vertex Exam by:: Amado Nash, RN  WIll start Pitocin now that she if more effaced

## 2021-11-19 NOTE — Progress Notes (Signed)
Kylie Wallace is a 21 y.o. G2P1001 at [redacted]w[redacted]d admitted for induction of labor due to oligohydramnios  Subjective: feeling contractions some, but not uncomfortable. Bad Rt ear pain w/ ear infection (woke up yesterday w/ it), requesting pain meds for that  Objective: BP 122/78   Pulse (!) 102   Temp 98.1 F (36.7 C) (Oral)   Resp 17   Ht 5\' 4"  (1.626 m)   Wt 103.4 kg   LMP 02/18/2021   SpO2 100%   BMI 39.12 kg/m  No intake/output data recorded.  FHR baseline 150 bpm, Variability: moderate, Accelerations:present, Decelerations:  Absent Toco: q 1-3 mins   SVE:   Dilation: 4 Effacement (%): 60 Station: -2 Exam by:: [email or web address], Koleen Nimrod, AROM attempted by unsuccessful  AROM by me, scant amt clear fluid  Pitocin @ 12 mu/min  Labs: Lab Results  Component Value Date   WBC 12.1 (H) 11/19/2021   HGB 10.8 (L) 11/19/2021   HCT 34.7 (L) 11/19/2021   MCV 86.3 11/19/2021   PLT 176 11/19/2021    Assessment / Plan: IOL d/t oligohydramnios, also has Rt ear infection w/ significant pain- currently on amoxicillin and requesting pain meds- apap earlier didn't help. Called pharmacy and there are no pain relief drops, can try percocet. S/P cytotec 25 vaginal (0057), pit started at 0515 and currently at 27mu/min, now AROM'd  Labor: early Fetal Wellbeing:  Category I Pain Control:  labor support without medications Pre-eclampsia: N/A I/D:  GBS neg, amoxicillin for otitis media Anticipated MOD: NSVB  18m CNM, WHNP-BC 11/19/2021, 10:59 AM

## 2021-11-19 NOTE — Progress Notes (Signed)
Patient ID: Kylie Wallace, female   DOB: [birth year], 21 y.o.   MRN: [id number] Kylie Wallace is a 21 y.o. G2P1001 at [redacted]w[redacted]d admitted for induction of labor due to oligohydramnios  Subjective: uncomfortable w/ contractions and feeling pressure  Objective: BP 121/66   Pulse 95   Temp 99.1 F (37.3 C) (Axillary)   Resp 17   Ht 5\' 4"  (1.626 m)   Wt 103.4 kg   LMP 02/18/2021   SpO2 100%   BMI 39.12 kg/m   FHR baseline 150 bpm, Variability: moderate, Accelerations: present Decelerations:  Not present Toco:  regular    SVE:   Dilation: 8 Effacement (%): 80 Station: -2 Exam by:: [email or web address], RN  Pitocin @ 14 mu/min  Assessment / Plan: IOL d/t oligohydramnios, s/p AROM @ 1055, clear fluid. Pitocin: currently at 14 mu/min.   Labor: active Fetal Wellbeing:  Category I Pain Control:  n/a Pre-eclampsia: N/A I/D:  GBS neg Anticipated MOD: NSVB  Barry Dienes, SNM  11/19/2021, 4:53 PM

## 2021-11-19 NOTE — H&P (Addendum)
Kylie Wallace is a 21 y.o. female presenting for IOL for oligohydramnios.  Low risk pregnancy, complicated by oliogohydramnios in third trimester.   Of note patient seen at [facility] ED for ear pain prior to admission today, and started on amoxicillin 500mg  TID, for presumed otitis media.   Patient denies VB, LOF. Monitor shows contractions but patient denies feeling them. +FM.   OB History     Gravida  2   Para  1   Term  1   Preterm  0   AB  0   Living  1      SAB  0   IAB  0   Ectopic  0   Multiple  0   Live Births  1          # Outcome Date GA Labor/2nd Weight Sex Delivery Anes PTL Lv A1 A5  1 Term 10/10/18 [redacted]w[redacted]d 8h 61m / 0h 70m 3045 g M Vag-Spont Local  Living 8 9  Name: 9m  Location: Other  Delivering Clinician: Milton Ferguson, CNM    2 Current                      Nursing Staff Provider  Office Location  [facility] Dating  LMP  [facility] Model [ X] Traditional [ ]  Centering [ ]  Mom-Baby Dyad      Language  English Anatomy [facility]  Normal, needs RVOT view  Flu Vaccine  declined Genetic/Carrier Screen  NIPS: LR female AFP:   normal Horizon: negative  TDaP Vaccine   09/20/21 Hgb A1C or  GTT Early: 5.2 Third trimester:normal  COVID Vaccine No   LAB RESULTS   Rhogam  n/a Blood Type A/Positive/-- (02/22 1129)   Baby Feeding Plan Breast/ Bottle Antibody Negative (02/22 1129)  Contraception POP Rubella 1.35 (02/22 1129)  Circumcision GIRL RPR Non Reactive (02/22 1129)   Pediatrician  [facility] HBsAg Negative (02/22 1129)   Support Person Jesus(FOB) HCVAb Negative  Prenatal Classes   HIV Non Reactive (02/22 1129)     BTL Consent NA GBS   (For PCN allergy, check sensitivities) gbs neg  VBAC Consent NA Pap 05/09/21 NILM           DME Rx 10-13-1982 ] BP cuff 10-13-1982 ] Weight Scale Waterbirth  [ ]  Class [ ]  Consent [ ]  CNM visit  PHQ9 & GAD7 [ x ] new OB [  ] 28 weeks  [  ] 36 weeks       Past Medical History:  Diagnosis Date   Medical history  non-contributory    Past Surgical History:  Procedure Laterality Date   WISDOM TOOTH EXTRACTION     Family History: family history includes Asthma in her sister; Diabetes in her maternal grandmother and paternal grandmother; Healthy in her father; Hypertension in her mother. Social History:  reports that she has never smoked. She has never used smokeless tobacco. She reports that she does not drink alcohol and does not use drugs.     Maternal Diabetes: No Genetic Screening: Normal Maternal Ultrasounds/Referrals: Oligohydramnios on 9/1 Fetal Ultrasounds or other Referrals:  None Maternal Substance Abuse:  No Significant Maternal Medications:  None Significant Maternal Lab Results:  Group B Strep negative Number of Prenatal Visits:greater than 3 verified prenatal visits Other Comments:   Currently being treated for otitis media  Review of Systems  Constitutional:  Negative for fever.  HENT:  Positive for ear pain. Negative for congestion, sinus pressure and  sore throat.   Respiratory:  Negative for shortness of breath.   Gastrointestinal:  Negative for abdominal pain.  Genitourinary:  Negative for vaginal bleeding and vaginal discharge.  Neurological:  Negative for headaches.   Maternal Medical History:  Contractions: Frequency: irregular.   Duration is approximately 1 minute.   Perceived severity is mild.   Fetal activity: Perceived fetal activity is normal.   Prenatal complications: Oligohydramnios.   No PIH.   Prenatal Complications - Diabetes: none.     Blood pressure 126/68, pulse 96, temperature 98.8 F (37.1 C), temperature source Oral, resp. rate 16, height 5\' 4"  (1.626 m), weight 103.4 kg, last menstrual period 02/18/2021, SpO2 100 %. Maternal Exam:  Uterine Assessment: Contraction strength is mild.  Contraction duration is 1 minute. Contraction frequency is irregular.  Abdomen: Patient reports no abdominal tenderness. Fetal presentation: vertex Introitus: Normal  vulva. Normal vagina.  Amniotic fluid character: not assessed. Pelvis: adequate for delivery.   Cervix: Cervix evaluated by digital exam.     Fetal Exam Fetal Monitor Review: Mode: fetoscope.   Baseline rate: 135.  Variability: moderate (6-25 bpm).   Pattern: accelerations present and no decelerations.   Fetal State Assessment: Category I - tracings are normal.   Physical Exam Constitutional:      Appearance: Normal appearance.  HENT:     Head: Normocephalic and atraumatic.  Eyes:     Extraocular Movements: Extraocular movements intact.  Cardiovascular:     Rate and Rhythm: Normal rate.  Pulmonary:     Effort: Pulmonary effort is normal.  Abdominal:     Tenderness: There is no abdominal tenderness.  Genitourinary:    General: Normal vulva.  Musculoskeletal:        General: Normal range of motion.     Cervical back: Normal range of motion.  Skin:    General: Skin is warm and dry.  Neurological:     General: No focal deficit present.     Mental Status: She is alert.  Psychiatric:        Mood and Affect: Mood normal.        Behavior: Behavior normal.     Prenatal labs: ABO, Rh: A/Positive/-- (02/22 1129) Antibody: Negative (02/22 1129) Rubella: 1.35 (02/22 1129) RPR: Non Reactive (06/20 0829)  HBsAg: Negative (02/22 1129)  HIV: Non Reactive (06/20 0829)  GBS: Negative/-- (08/17 1109)   Assessment/Plan: Admit to L&D for scheduled IOL for oligohydramnios Will start with cervical ripening with vaginal cytotec  Likely can start pitocin with next cervical check  AROM as able Anticipate vaginal delivery  Expecting a girl! Bottle and breast feeding desired Contraception: POPs   Otitis media: will continue amoxicillin 500mg  TID  05-07-1977 MD PGY3 11/19/2021, 12:32 AM    Attestation:  I confirm that I have verified the information documented in the resident's note and that I have also personally reperformed the physical exam and all medical decision making  activities.   The patient was seen and examined by me also Agree with note NST reactive and reassuring UCs as listed Cervical exams as listed in note  Wyn Forster, CNM

## 2021-11-20 LAB — CBC
HCT: 28.9 % — ABNORMAL LOW (ref 36.0–46.0)
Hemoglobin: 9.2 g/dL — ABNORMAL LOW (ref 12.0–15.0)
MCH: 27.3 pg (ref 26.0–34.0)
MCHC: 31.8 g/dL (ref 30.0–36.0)
MCV: 85.8 fL (ref 80.0–100.0)
Platelets: 176 10*3/uL (ref 150–400)
RBC: 3.37 MIL/uL — ABNORMAL LOW (ref 3.87–5.11)
RDW: 17.2 % — ABNORMAL HIGH (ref 11.5–15.5)
WBC: 13.9 10*3/uL — ABNORMAL HIGH (ref 4.0–10.5)
nRBC: 0 % (ref 0.0–0.2)

## 2021-11-20 MED ORDER — FERROUS SULFATE 325 (65 FE) MG PO TABS
325.0000 mg | ORAL_TABLET | ORAL | Status: DC
  Administered 2021-11-20: 325 mg via ORAL
  Filled 2021-11-20: qty 1

## 2021-11-20 NOTE — Progress Notes (Addendum)
POSTPARTUM PROGRESS NOTE  Post Partum Day #1  Subjective:  Kylie Wallace is a 21 y.o. Q3E0923 s/p SVD at [redacted]w[redacted]d.  She reports she is doing well. No acute events overnight. She denies any problems with ambulating, voiding or po intake. Denies nausea or vomiting.  Pain is well controlled.  Lochia is appropriate.  Objective: Blood pressure 110/69, pulse 78, temperature 97.9 F (36.6 C), temperature source Oral, resp. rate 20, height 5\' 4"  (1.626 m), weight 103.4 kg, last menstrual period 02/18/2021, SpO2 100 %, unknown if currently breastfeeding.  Physical Exam:  General: alert, cooperative and no distress HEENT: Mild pain in right ear with muffled hearing.  Chest: no respiratory distress Heart:regular rate, distal pulses intact Abdomen: soft, nontender,  Uterine Fundus: firm, appropriately tender DVT Evaluation: No calf swelling or tenderness Extremities: No edema Skin: warm, dry  Recent Labs    11/19/21 2031 11/20/21 0450  HGB 10.8* 9.2*  HCT 33.2* 28.9*    Assessment/Plan: Kylie Wallace is a 21 y.o. 30 s/p SVD at [redacted]w[redacted]d   PPD#1 - Doing well Routine postpartum care PPH: Hgb dropped as above. Ordered ferrous sulfate. Isolated fever 11/19/21 at 2100 of 101.3. Responded well to tylenol. Patient is on amoxicillin 500mg  TID for treatment of right otitis media.  Contraception: POPs Feeding: Breast and Bottle Dispo: Plan for discharge 11/21/21.   LOS: 1 day   , MD Resident Physician 11/20/2021, 6:33 AM  GME ATTESTATION:  I saw and evaluated the patient. I agree with the findings and the plan of care as documented in the resident's note. I have made changes to documentation as necessary.  Moody Bruins, DO OB Fellow, Faculty [facility] 11/20/2021, 6:56 AM

## 2021-11-20 NOTE — Lactation Note (Signed)
This note was copied from a baby's chart. Lactation Consultation Note  Patient Name: Kylie Wallace [id number] Date: 11/20/2021 Reason for consult: Mother's request;Follow-up assessment Age:23 hours, P2 with -3% weight loss  Per Birth Parent, she does want to BF infant, infant has been latching for 5 to 10 minutes some feedings, she  has makes attempts to latch infant before supplementing infant with formula. LC unable assist with latch infant asleep in bassinet and Per Birth Parent, infant given 43 mls of formula at 2100 pm which she did not latch infant at that feeding. Birth Parent would like to be set up with DEBP to help establish her milk supply as she works towards infant latching at the breast.  Birth Parent fitted with 24 mm breast flange and was expressing colostrum in breast flange, Birth Parent  was still expressing ( had 5 mls in breast flange  ) as LC left the room. Birth Parent knows Breast milk is safe for 4 hours at room temperature whereas formula is safe for only 1 hour. Birth Parent Feeding plan: 1- Continue to BF infant according to hunger cues, on demand, 8 to 12+ times within 24 hrs, ask RN/LC for latch assistance if need. 2- Birth Parent plans to continue to supplement infant, if infant latches at breast follow BF supplemental guidelines sheet that was given, offer any EBM first and then formula. 3- Birth Parent plans to continue to use DEBP every 3 hours for 15 minutes on initial setting. Maternal Data    Feeding Mother's Current Feeding Choice: Breast Milk and Formula  LATCH Score                    Lactation Tools Discussed/Used Tools: Pump Breast pump type: Double-Electric Breast Pump Pump Education: Setup, frequency, and cleaning;Milk Storage Reason for Pumping: Birth Parent request, Birth Parent is working on latch infant is being supplemented with formula Pumping frequency: Continue to use DEBP every 3 hours for 15 minutes on inital  setting. Pumped volume: 5 mL (Still expressing colostrum when LC left the room.)  Interventions Interventions: Expressed milk;DEBP;Education  Discharge    Consult Status Consult Status: Follow-up Date: 11/20/21 Follow-up type: In-patient    Kylie Wallace 11/20/2021, 11:52 PM

## 2021-11-21 MED ORDER — ACETAMINOPHEN 325 MG PO TABS: 650.0000 mg | ORAL_TABLET | ORAL | Status: DC | PRN

## 2021-11-21 MED ORDER — IBUPROFEN 600 MG PO TABS: 600.0000 mg | ORAL_TABLET | Freq: Four times a day (QID) | ORAL | 0 refills | Status: DC

## 2021-11-21 MED ORDER — SLYND 4 MG PO TABS: 1.0000 | ORAL_TABLET | Freq: Every day | ORAL | 0 refills | Status: DC

## 2021-11-21 MED ORDER — AMOXICILLIN 500 MG PO CAPS: 500.0000 mg | ORAL_CAPSULE | Freq: Three times a day (TID) | ORAL | 0 refills | Status: AC

## 2021-11-21 NOTE — Progress Notes (Signed)
Was called to pt room and pt was crying and holding her R ear, describing pain to be severe, 10/10 on pain scale. Pt was given PRN dose 650 mg of Tylenol. Pt's husband did ask to take patient to ER to relieve symptoms. Did reassure pt and partner and inform that she cannot visit ER as a current pt. 2 disposable heat packs were also given as comfort for pt. Called the Dr. On call and was given orders to see if Tylenol relieves pain effectively.

## 2021-11-22 ENCOUNTER — Encounter: Payer: Medicaid Other | Admitting: Obstetrics and Gynecology

## 2021-11-23 ENCOUNTER — Ambulatory Visit: Payer: Medicaid Other

## 2021-11-23 LAB — TYPE AND SCREEN
ABO/RH(D): A POS
Antibody Screen: POSITIVE
Donor AG Type: NEGATIVE
Donor AG Type: NEGATIVE
Unit division: 0
Unit division: 0

## 2021-11-23 LAB — BPAM RBC
Blood Product Expiration Date: 202309282359
Blood Product Expiration Date: 202310012359
Unit Type and Rh: 5100
Unit Type and Rh: 5100

## 2021-11-29 ENCOUNTER — Telehealth (HOSPITAL_COMMUNITY): Payer: Self-pay | Admitting: *Deleted

## 2021-11-29 NOTE — Telephone Encounter (Signed)
Attempted Hospital Discharge Follow-Up Call.  Left voice mail requesting that patient return RN's phone call if patient has any concerns or questions regarding herself or her baby.  

## 2021-12-26 ENCOUNTER — Other Ambulatory Visit: Payer: Self-pay | Admitting: Family Medicine

## 2022-01-03 ENCOUNTER — Encounter: Payer: Self-pay | Admitting: Family Medicine

## 2022-01-03 ENCOUNTER — Ambulatory Visit (INDEPENDENT_AMBULATORY_CARE_PROVIDER_SITE_OTHER): Payer: Medicaid Other | Admitting: Family Medicine

## 2022-01-03 ENCOUNTER — Other Ambulatory Visit: Payer: Self-pay

## 2022-01-03 DIAGNOSIS — Z3041 Encounter for surveillance of contraceptive pills: Secondary | ICD-10-CM | POA: Diagnosis not present

## 2022-01-03 DIAGNOSIS — L709 Acne, unspecified: Secondary | ICD-10-CM | POA: Diagnosis not present

## 2022-01-03 MED ORDER — ADAPALENE 0.1 % EX CREA: TOPICAL_CREAM | Freq: Every day | CUTANEOUS | 0 refills | Status: DC

## 2022-01-03 MED ORDER — SLYND 4 MG PO TABS: 1.0000 | ORAL_TABLET | Freq: Every day | ORAL | 4 refills | Status: DC

## 2022-01-03 MED ORDER — BENZOYL PEROXIDE WASH 5 % EX LIQD: Freq: Every day | CUTANEOUS | 12 refills | Status: AC

## 2022-01-03 NOTE — Progress Notes (Signed)
Post Partum Visit Note  Kylie Wallace is a 21 y.o. G62P2002 female who presents for a postpartum visit. She is 6 weeks postpartum following a normal spontaneous vaginal delivery.  I have fully reviewed the prenatal and intrapartum course. The delivery was at [redacted]w[redacted]d gestational weeks.  Anesthesia: none. Postpartum course has been well . Baby is doing well. Baby is feeding by both breast and bottle - Similac Advance. Bleeding thin lochia. Bowel function is normal. Bladder function is normal. Patient is not sexually active. Contraception method is OCP (estrogen/progesterone). Postpartum depression screening: negative.   The pregnancy intention screening data noted above was reviewed. Potential methods of contraception were discussed. The patient elected to proceed with No data recorded.   Edinburgh Postnatal Depression Scale - 01/03/22 0958       Edinburgh Postnatal Depression Scale:  In the Past 7 Days   I have been able to laugh and see the funny side of things. 0    I have looked forward with enjoyment to things. 0    I have blamed myself unnecessarily when things went wrong. 0    I have been anxious or worried for no good reason. 0    I have felt scared or panicky for no good reason. 0    Things have been getting on top of me. 1    I have been so unhappy that I have had difficulty sleeping. 0    I have felt sad or miserable. 0    I have been so unhappy that I have been crying. 0    The thought of harming myself has occurred to me. 0    Edinburgh Postnatal Depression Scale Total 1             Health Maintenance Due  Topic Date Due   COVID-19 Vaccine (1) Never done   HPV VACCINES (1 - 2-dose series) Never done   INFLUENZA VACCINE  10/16/2021    The following portions of the patient's history were reviewed and updated as appropriate: allergies, current medications, past family history, past medical history, past social history, past surgical history, and problem list.  Review  of Systems Pertinent items are noted in HPI.  Objective:  BP 111/75   Pulse 66   Wt 200 lb 12.8 oz (91.1 kg)   LMP 02/18/2021   Breastfeeding Yes   BMI 34.47 kg/m    General:  alert, cooperative, and appears stated age   Breasts:  normal  Lungs: clear to auscultation bilaterally  Heart:  regular rate and rhythm, S1, S2 normal, no murmur, click, rub or gallop  Abdomen: soft, non-tender; bowel sounds normal; no masses,  no organomegaly   Wound No wound  GU exam:  not indicated       Assessment:    1. Routine postpartum follow-up - Drospirenone (SLYND) 4 MG TABS; Take 1 tablet by mouth daily.  Dispense: 28 tablet; Refill: 4  2. Encounter for surveillance of contraceptive pills - Drospirenone (SLYND) 4 MG TABS; Take 1 tablet by mouth daily.  Dispense: 28 tablet; Refill: 4  3. Acne, unspecified acne type - benzoyl peroxide 5 % external liquid; Apply topically at bedtime.  Dispense: 142 g; Refill: 12 - adapalene (DIFFERIN) 0.1 % cream; Apply topically at bedtime.  Dispense: 45 g; Refill: 0 Started tx for acne, needs PCP for f/up  Normal postpartum exam.   Plan:   Essential components of care per ACOG recommendations:  1.  Mood and well being: Patient with  negative depression screening today. Reviewed local resources for support.  - Patient tobacco use? No.   - hx of drug use? No.    2. Infant care and feeding:  -Patient currently breastmilk feeding? Yes. Reviewed importance of draining breast regularly to support lactation.  -Social determinants of health (SDOH) reviewed in EPIC. No concerns 3. Sexuality, contraception and birth spacing - Patient does not want a pregnancy in the next year.  - Reviewed reproductive life planning. Reviewed contraceptive methods based on pt preferences and effectiveness.  Patient desired Oral Contraceptive today.   - Discussed birth spacing of 18 months  4. Sleep and fatigue -Encouraged family/partner/community support of 4 hrs of  uninterrupted sleep to help with mood and fatigue  5. Physical Recovery  - Discussed patients delivery and complications. She describes her labor as good. - Patient had a Vaginal, no problems at delivery. Patient had no laceration. Perineal healing reviewed. Patient expressed understanding - Patient has urinary incontinence? No. - Patient is safe to resume physical and sexual activity  6.  Health Maintenance - HM due items addressed Yes - Last pap smear  Diagnosis  Date Value Ref Range Status  05/09/2021   Final   - Negative for intraepithelial lesion or malignancy (NILM)   Pap smear not done at today's visit.  -Breast Cancer screening indicated? No.   7. Chronic Disease/Pregnancy Condition follow up: None  - PCP follow up- information about PCP sent  Shelda Pal, [place] [facility] [place], [place]

## 2022-01-03 NOTE — Patient Instructions (Signed)
[facility] - Pediatrics at [facility] Get online care: [email or web address] Located in: [place] Address: [address], [place], [place] [address]  Closes 5:30?PM Phone: [phone]

## 2022-01-16 ENCOUNTER — Ambulatory Visit (INDEPENDENT_AMBULATORY_CARE_PROVIDER_SITE_OTHER): Payer: Medicaid Other

## 2022-01-16 ENCOUNTER — Other Ambulatory Visit: Payer: Self-pay

## 2022-01-16 VITALS — BP 109/73 | HR 87

## 2022-01-16 DIAGNOSIS — Z23 Encounter for immunization: Secondary | ICD-10-CM

## 2022-01-16 NOTE — Progress Notes (Signed)
Kylie Wallace is here today to discuss beginning Depo Provera. Pt was seen for PP visit on 01/03/22 and note from that day states patient planned oral contraceptives. Pt states she was planning to take [facility], but was not given clear instructions about how to use. Pt states she took 1 pill and then forgot to continue taking. Pt is very concerned about missing pills. Reviewed Slynd admin instructions. Offered to speak with provider about initiation of Depo Provera. Pt inquires about other long term options. Reviewed IUD and Nexplanon. Pt would like to continue Slynd and continue to consider LARC.   Flu vaccine given.   Pt reports boil on right leg, near groin area. States she gets frequent boils under breasts and armpits. No provider available for exam today, will return for exam.   Annabell Howells, RN 01/16/2022  3:07 PM

## 2022-01-22 ENCOUNTER — Ambulatory Visit (INDEPENDENT_AMBULATORY_CARE_PROVIDER_SITE_OTHER): Payer: Medicaid Other | Admitting: Advanced Practice Midwife

## 2022-01-22 ENCOUNTER — Other Ambulatory Visit: Payer: Self-pay

## 2022-01-22 VITALS — BP 105/61 | HR 70

## 2022-01-22 DIAGNOSIS — L732 Hidradenitis suppurativa: Secondary | ICD-10-CM

## 2022-01-22 NOTE — Patient Instructions (Signed)
AREA FAMILY PRACTICE PHYSICIANS  [place] ([address]) [facility] [address] [place], [place] [address] [phone] Mon-Fri 8:30-12:30, 1:30-5:00 Accepting Medicaid [facility] [place] [address], [place], [place] [address] [phone] Mon-Fri 8:00-5:30 [facility] [address] [place], [place] [address] [phone] Mon, Tue, Thur, Fri 8:30-5:00, Wed 10:00-7:00 (closed 1-2pm) Accepting Medicaid [facility] [address], [place], [place]  [address] Phone - [phone]   Fax - [phone]  [place] ([address]) [facility] [address] [place], [place] [address] [phone] Mon-Fri 8:00-5:00 [facility] [place] ([facility])  [address] [place], [place] [address] [phone] Mon-Fri 8:30-5:30, Sat (Oct.-Mar.) 9:00-1:00 Accepting Medicaid  [place] ([address]) [facility] [address], [place], [place] [address] [phone] Mon-Fri 8:00-5:00  [place] ([address]) [facility] [employer] [address], [place], [place] [address] [phone] Mon-Fri 8:00-5:00 [facility] [place] [address], [place], [place] [address] [phone] Mon-Fri 8:00-5:00 [facility] [place] [address] [place], [place] [address] [phone] Mon-Fri 8:00-5:00 [facility] [address] [place] [address] [phone] Mon-Fri 7:30-5:30  [place] ([address] & [address]) [facility] [address] [place], [place] [address] [phone] Mon-Thur 8:00-6:00 Accepting Medicaid [facility] [address] [place], [place] [address] [phone] Mon-Thur 7:30-7:30, Fri 7:30-4:30 Accepting  Medicaid [facility] [place] [address], [place], [place]  [address] [phone]   Fax - [phone]  [place] ([address] & [address]) [facility] [address] [place], [place] [address] [phone] Mon-Fri 7:00-5:00 [facility] [address], [place], [place] [address] [phone] Mon-Fri 8:00-5:00 Accepting Medicaid [facility] [address], [place], [place] [address] [phone] Mon-Fri 8:00-5:00 Accepting Medicaid  [place] ([address]) [facility] [place] [address] [place], [place] [address] [phone] Mon-Fri 8:00-5:00 [facility] [address], [place], [place] [address] [phone] Mon-Fri 8:00-5:00 Accepting Medicaid [facility] ([facility]) [address], [place], [place] [address] [phone] Mon-Fri 8:00-5:30, Sat&Sun by appointment (phones open at 8:30) Accepting Medicaid  [place] ([address]) [facility] [address] [place], [place] [address] [phone] Mon-Thur 8:00-7:00, Fri 8:00-5:00, Sat 8:00-12:00, Sun 9:00-12:00 Accepting Medicaid [facility] [place] [address] [place] [address], [place], [place] [address] [phone] Mon-Thur 8:00-5:00 Accepting Medicaid [facility] [place] [address] [place], [place] [address] [phone] Mon-Fri 8:00-5:30, Sat (Oct.-Mar.) 9:00-1:00 Accepting Medicaid  [place] ([address]) [facility] [address], [place], [place] [address] [phone] Mon-Fri 8:00-5:00 Accepting Medicaid   [place] ([address]) [facility] [place] [address], [place], [place] [address] [phone] Mon-Fri 8:00-5:00 [facility] [place] [address], [place], [place] [address] [phone] Mon-Fri 8:00-5:00 [facility] [place] [address] [place] [address], [place],  [address] [phone] Mon-Fri 8:00-5:00 After hours clinic ([address] [place],  [address]) [phone] Mon-Fri 5:00-8:00, Sat 12:00-6:00, Sun 10:00-4:00 Accepting Medicaid [facility] [place]   [address], [place], [place]  [address] [phone]   Fax - [phone]  [place] ([address]) [facility] [place] [address], [place], [place] [address] [phone] Mon-Fri 8:00-5:00 [facility] [place] ([facility] [place]) [address], [place], [place] [address] [phone] Mon-Thur 8:00-7:00, Fri 8:00-5:00, Sat 8:00-12:00    

## 2022-01-22 NOTE — Progress Notes (Signed)
   Established Patient Office Visit  Subjective   Patient ID: Kylie Wallace, female    DOB: March 06, 2001  Age: 21 y.o. MRN: [id number]  Chief Complaint  Patient presents with   Recurrent Skin Infections    Pt is here for sores on her inner thighs. Has had sores on her inner thighs and axillae for several years. The only care she has receid was I&D of sore in her axilla. Denies fever, chills. No active drainage now. Only has one sore on her thigh currently.  :  Patient Active Problem List   Diagnosis Date Noted   Oligohydramnios 11/19/2021   Postpartum hemorrhage 11/19/2021   Past Medical History:  Diagnosis Date   Medical history non-contributory    Past Surgical History:  Procedure Laterality Date   WISDOM TOOTH EXTRACTION     No Known Allergies    Review of Systems  Constitutional:  Negative for chills and fever.  Skin:        Pos for non-draining, non-tender sore on left inner thigh.      Objective:     BP 105/61   Pulse 70     Physical Exam Constitutional:      Appearance: She is normal weight.  HENT:     Head: Normocephalic.  Cardiovascular:     Rate and Rhythm: Normal rate.  Musculoskeletal:        General: No swelling.  Skin:    General: Skin is warm and dry.     Findings: Lesion present. No abscess or rash.     Comments: 5 mm healing sore on left inner thigh. No tenderness, drainage, erythema, warmth. Several scars on inner thighs, groin, axilla.   Neurological:     Mental Status: She is alert.        Assessment & Plan:   Problem List Items Addressed This Visit   None Visit Diagnoses     Hidradenitis suppurativa    -  Primary   Relevant Orders   Ambulatory referral to Dermatology     Suspect Hidradenitis Suppurativa vs cystic acne.  Discussed that if this is HS, drainage of lesions can often cause worsening of Sx. Tx is challenging and may include ABX.  Dermatology referral  Return for as needed.    Manya Silvas, CNM

## 2022-02-21 ENCOUNTER — Encounter: Payer: Self-pay | Admitting: *Deleted

## 2022-03-05 ENCOUNTER — Ambulatory Visit
Admission: EM | Admit: 2022-03-05 | Discharge: 2022-03-05 | Disposition: A | Discharge status: Home / Self Care | Payer: Medicaid Other | Attending: Physician Assistant | Admitting: Physician Assistant

## 2022-03-05 DIAGNOSIS — L0291 Cutaneous abscess, unspecified: Secondary | ICD-10-CM

## 2022-03-05 DIAGNOSIS — L732 Hidradenitis suppurativa: Secondary | ICD-10-CM

## 2022-03-05 MED ORDER — AMOXICILLIN-POT CLAVULANATE 875-125 MG PO TABS: 1.0000 | ORAL_TABLET | Freq: Two times a day (BID) | ORAL | 0 refills | Status: DC

## 2022-03-05 NOTE — ED Triage Notes (Signed)
Pt presents with abscess to L chest wall x 5 days.  1" across, tender, warm to touch. Has not been draining. Denies fevers.   Has h/o of abscesses in this area.

## 2022-03-08 ENCOUNTER — Encounter: Payer: Self-pay | Admitting: Physician Assistant

## 2022-03-08 NOTE — ED Provider Notes (Signed)
[facility]    CSN: [id number] Arrival date & time: 03/05/22  5697      History   Chief Complaint No chief complaint on file.   HPI Kylie Wallace is a 21 y.o. female.   Patient here today for evaluation of possible abscess to her chest.  She reports history of hidradenitis suppurativa and has had several abscesses in the area at the time. She has not had any fever. She denies any drainage from the area. She has had drainage of abscesses in the past but most recently was instructed to use antibiotics due to likelihood of recurrence and worsening symptoms given known HS.   The history is provided by the patient.    Past Medical History:  Diagnosis Date   Medical history non-contributory     There are no problems to display for this patient.   Past Surgical History:  Procedure Laterality Date   WISDOM TOOTH EXTRACTION      OB History     Gravida  2   Para  2   Term  2   Preterm  0   AB  0   Living  2      SAB  0   IAB  0   Ectopic  0   Multiple  0   Live Births  2            Home Medications    Prior to Admission medications   Medication Sig Start Date End Date Taking? Authorizing Provider  amoxicillin-clavulanate (AUGMENTIN) 875-125 MG tablet Take 1 tablet by mouth every 12 (twelve) hours. 03/05/22  Yes Tomi Bamberger, PA-C  Drospirenone (SLYND) 4 MG TABS Take 1 tablet by mouth daily. 01/03/22  Yes Mercado-Ortiz, Lahoma Crocker, DO  acetaminophen (TYLENOL) 325 MG tablet Take 2 tablets (650 mg total) by mouth every 4 (four) hours as needed (for pain scale < 4). Patient not taking: Reported on 01/22/2022 11/21/21   Celine Mans, MD  adapalene (DIFFERIN) 0.1 % cream Apply topically at bedtime. Patient not taking: Reported on 01/22/2022 01/03/22   Mercado-Ortiz, Lahoma Crocker, DO  ibuprofen (ADVIL) 600 MG tablet Take 1 tablet (600 mg total) by mouth every 6 (six) hours. Patient not taking: Reported on 01/22/2022 11/21/21   Celine Mans,  MD  Prenatal Vit-Fe Fumarate-FA (PRENATAL PO) Take 1 capsule by mouth daily. Patient not taking: Reported on 01/22/2022    [provider]  norethindrone (MICRONOR) 0.35 MG tablet Take 1 tablet (0.35 mg total) by mouth daily. 11/10/18 01/04/19  Dorathy Kinsman, CNM    Family History Family History  Problem Relation Age of Onset   Hypertension Mother    Healthy Father    Asthma Sister    Diabetes Maternal Grandmother    Diabetes Paternal Grandmother    Birth defects Neg Hx    Cancer Neg Hx    Heart disease Neg Hx    Stroke Neg Hx     Social History Social History   Tobacco Use   Smoking status: Never   Smokeless tobacco: Never  Vaping Use   Vaping Use: Never used  Substance Use Topics   Alcohol use: No   Drug use: No     Allergies   Patient has no known allergies.   Review of Systems Review of Systems  Constitutional:  Negative for chills and fever.  HENT:  Negative for congestion.   Eyes:  Negative for discharge and redness.  Respiratory:  Negative for shortness of breath.  Gastrointestinal:  Negative for nausea and vomiting.  Skin:  Positive for color change.     Physical Exam Triage Vital Signs ED Triage Vitals  Enc Vitals Group     BP 03/05/22 1926 121/83     Pulse Rate 03/05/22 1926 68     Resp 03/05/22 1926 18     Temp 03/05/22 1926 98 F (36.7 C)     Temp Source 03/05/22 1926 Oral     SpO2 03/05/22 1926 99 %     Weight --      Height --      Head Circumference --      Peak Flow --      Pain Score 03/05/22 1924 0     Pain Loc --      Pain Edu? --      Excl. in GC? --    No data found.  Updated Vital Signs BP 121/83 (BP Location: Left Arm)   Pulse 68   Temp 98 F (36.7 C) (Oral)   Resp 18   SpO2 99%   Breastfeeding Yes     Physical Exam Vitals and nursing note reviewed.  Constitutional:      General: She is not in acute distress.    Appearance: Normal appearance. She is not ill-appearing.  HENT:     Head: Normocephalic  and atraumatic.  Eyes:     Conjunctiva/sclera: Conjunctivae normal.  Cardiovascular:     Rate and Rhythm: Normal rate.  Pulmonary:     Effort: Pulmonary effort is normal.  Skin:    Findings: Erythema present.     Comments: Approx 2 cm area of erythema, induration to left upper chest with no fluctuance noted  Neurological:     Mental Status: She is alert.      UC Treatments / Results  Labs (all labs ordered are listed, but only abnormal results are displayed) Labs Reviewed - No data to display  EKG   Radiology No results found.  Procedures Procedures (including critical care time)  Medications Ordered in UC Medications - No data to display  Initial Impression / Assessment and Plan / UC Course  I have reviewed the triage vital signs and the nursing notes.  Pertinent labs & imaging results that were available during my care of the patient were reviewed by me and considered in my medical decision making (see chart for details).    Augmentin prescribed. I and D deferred today. Recommended follow up if no gradual improvement with oral antibiotic treatment or with any further concerns.   Final Clinical Impressions(s) / UC Diagnoses   Final diagnoses:  Abscess  Hidradenitis suppurativa   Discharge Instructions   None    ED Prescriptions     Medication Sig Dispense Auth. Provider   amoxicillin-clavulanate (AUGMENTIN) 875-125 MG tablet Take 1 tablet by mouth every 12 (twelve) hours. 14 tablet Tomi Bamberger, PA-C      PDMP not reviewed this encounter.   Tomi Bamberger, PA-C 03/08/22 [phone]

## 2022-07-10 ENCOUNTER — Other Ambulatory Visit: Payer: Self-pay | Admitting: Lactation Services

## 2022-07-10 DIAGNOSIS — Z3041 Encounter for surveillance of contraceptive pills: Secondary | ICD-10-CM

## 2022-07-10 MED ORDER — SLYND 4 MG PO TABS: 1.0000 | ORAL_TABLET | Freq: Every day | ORAL | 6 refills | Status: DC

## 2022-08-27 ENCOUNTER — Emergency Department (HOSPITAL_BASED_OUTPATIENT_CLINIC_OR_DEPARTMENT_OTHER): Payer: Medicaid Other | Admitting: Radiology

## 2022-08-27 ENCOUNTER — Emergency Department (HOSPITAL_BASED_OUTPATIENT_CLINIC_OR_DEPARTMENT_OTHER)
Admission: EM | Admit: 2022-08-27 | Discharge: 2022-08-27 | Disposition: A | Discharge status: Home / Self Care | Payer: Medicaid Other | Attending: Emergency Medicine | Admitting: Emergency Medicine

## 2022-08-27 ENCOUNTER — Encounter (HOSPITAL_BASED_OUTPATIENT_CLINIC_OR_DEPARTMENT_OTHER): Payer: Self-pay | Admitting: Emergency Medicine

## 2022-08-27 ENCOUNTER — Other Ambulatory Visit: Payer: Self-pay

## 2022-08-27 DIAGNOSIS — R1013 Epigastric pain: Secondary | ICD-10-CM | POA: Diagnosis not present

## 2022-08-27 LAB — CBC
HCT: 38 % (ref 36.0–46.0)
Hemoglobin: 12.3 g/dL (ref 12.0–15.0)
MCH: 27.1 pg (ref 26.0–34.0)
MCHC: 32.4 g/dL (ref 30.0–36.0)
MCV: 83.7 fL (ref 80.0–100.0)
Platelets: 233 10*3/uL (ref 150–400)
RBC: 4.54 MIL/uL (ref 3.87–5.11)
RDW: 14 % (ref 11.5–15.5)
WBC: 9.1 10*3/uL (ref 4.0–10.5)
nRBC: 0 % (ref 0.0–0.2)

## 2022-08-27 LAB — COMPREHENSIVE METABOLIC PANEL
ALT: 15 U/L (ref 0–44)
AST: 14 U/L — ABNORMAL LOW (ref 15–41)
Albumin: 4.2 g/dL (ref 3.5–5.0)
Alkaline Phosphatase: 81 U/L (ref 38–126)
Anion gap: 10 (ref 5–15)
BUN: 18 mg/dL (ref 6–20)
CO2: 24 mmol/L (ref 22–32)
Calcium: 9.1 mg/dL (ref 8.9–10.3)
Chloride: 105 mmol/L (ref 98–111)
Creatinine, Ser: 0.64 mg/dL (ref 0.44–1.00)
GFR, Estimated: >60 mL/min (ref >60)
Glucose, Bld: 107 mg/dL — ABNORMAL HIGH (ref 70–99)
Potassium: 4.1 mmol/L (ref 3.5–5.1)
Sodium: 139 mmol/L (ref 135–145)
Total Bilirubin: 0.2 mg/dL — ABNORMAL LOW (ref 0.3–1.2)
Total Protein: 7.1 g/dL (ref 6.5–8.1)

## 2022-08-27 LAB — LIPASE, BLOOD: Lipase: 24 U/L (ref 11–51)

## 2022-08-27 LAB — TROPONIN I (HIGH SENSITIVITY): Troponin I (High Sensitivity): <2 ng/L (ref <18)

## 2022-08-27 MED ORDER — ALUM & MAG HYDROXIDE-SIMETH 200-200-20 MG/5ML PO SUSP
30.0000 mL | Freq: Once | ORAL | Status: AC
  Administered 2022-08-27: 30 mL via ORAL
  Filled 2022-08-27: qty 30

## 2022-08-27 MED ORDER — SUCRALFATE 1 G PO TABS: 1.0000 g | ORAL_TABLET | Freq: Four times a day (QID) | ORAL | 0 refills | Status: DC | PRN

## 2022-08-27 MED ORDER — OMEPRAZOLE 20 MG PO CPDR: 20.0000 mg | DELAYED_RELEASE_CAPSULE | Freq: Every day | ORAL | 1 refills | Status: DC

## 2022-08-27 NOTE — ED Triage Notes (Signed)
Pt c/o epigastric pain that radiates through to her back. Endorses vomiting x 1.

## 2022-08-27 NOTE — Discharge Instructions (Signed)
You were evaluated in the Emergency Department and after careful evaluation, we did not find any emergent condition requiring admission or further testing in the hospital.  Your exam/testing today is overall reassuring.  Symptoms may be due to stomach acid related pain.  Use the omeprazole and Carafate medications as we discussed.  Follow-up with a primary care doctor.  Please return to the Emergency Department if you experience any worsening of your condition.   Thank you for allowing [place] to be a part of your care.

## 2022-08-27 NOTE — ED Provider Notes (Signed)
[facility] Emergency Department Provider Note MRN:  [id number]  Arrival date & time: 08/27/22     Chief Complaint   Chest Pain   History of Present Illness   Kylie Wallace is a 22 y.o. year-old female with no pertinent past medical history presenting to the ED with chief complaint of chest pain.  Lower chest/epigastric pain with radiation to the middle of the back, present for the past several hours.  Associated with vomiting 1 time.  No diarrhea, no shortness of breath, pain described as a sharp pain but is unchanged with deep breaths.  Patient does take birth control pills.  Review of Systems  A thorough review of systems was obtained and all systems are negative except as noted in the HPI and PMH.   Patient's Health History    Past Medical History:  Diagnosis Date   Medical history non-contributory     Past Surgical History:  Procedure Laterality Date   WISDOM TOOTH EXTRACTION      Family History  Problem Relation Age of Onset   Hypertension Mother    Healthy Father    Asthma Sister    Diabetes Maternal Grandmother    Diabetes Paternal Grandmother    Birth defects Neg Hx    Cancer Neg Hx    Heart disease Neg Hx    Stroke Neg Hx     Social History   Socioeconomic History   Marital status: Significant Other    Spouse name: Not on file   Number of children: Not on file   Years of education: Not on file   Highest education level: Not on file  Occupational History   Not on file  Tobacco Use   Smoking status: Never   Smokeless tobacco: Never  Vaping Use   Vaping Use: Never used  Substance and Sexual Activity   Alcohol use: No   Drug use: No   Sexual activity: Yes    Birth control/protection: Pill  Other Topics Concern   Not on file  Social History Narrative   Not on file   Social Determinants of Health   Financial Resource Strain: Not on file  Food Insecurity: No Food Insecurity (10/23/2021)   Hunger Vital Sign    Worried  About Running Out of Food in the Last Year: Never true    Ran Out of Food in the Last Year: Never true  Transportation Needs: No Transportation Needs (10/23/2021)   PRAPARE - Administrator, Civil Service (Medical): No    Lack of Transportation (Non-Medical): No  Physical Activity: Not on file  Stress: Not on file  Social Connections: Not on file  Intimate Partner Violence: Not At Risk (04/28/2018)   Humiliation, Afraid, Rape, and Kick questionnaire    Fear of Current or Ex-Partner: No    Emotionally Abused: No    Physically Abused: No    Sexually Abused: No     Physical Exam   Vitals:   08/27/22 0334  BP: 136/88  Pulse: 69  Resp: 18  Temp: 98.4 F (36.9 C)  SpO2: 100%    CONSTITUTIONAL: Well-appearing, NAD NEURO/PSYCH:  Alert and oriented x 3, no focal deficits EYES:  eyes equal and reactive ENT/NECK:  no LAD, no JVD CARDIO: Regular rate, well-perfused, normal S1 and S2 PULM:  CTAB no wheezing or rhonchi GI/GU:  non-distended, non-tender MSK/SPINE:  No gross deformities, no edema SKIN:  no rash, atraumatic   *Additional and/or pertinent findings included in MDM below  Diagnostic and Interventional Summary    EKG Interpretation  Date/Time:  Tuesday August 27 2022 03:38:37 EDT Ventricular Rate:  69 PR Interval:  119 QRS Duration: 109 QT Interval:  428 QTC Calculation: 459 R Axis:   81 Text Interpretation: Sinus rhythm Borderline short PR interval Low voltage, precordial leads Baseline wander in lead(s) II III aVF Confirmed by Kennis Carina [phone]) on 08/27/2022 3:53:08 AM       Labs Reviewed  COMPREHENSIVE METABOLIC PANEL - Abnormal; Notable for the following components:      Result Value   Glucose, Bld 107 (*)    AST 14 (*)    Total Bilirubin 0.2 (*)    All other components within normal limits  CBC  LIPASE, BLOOD  TROPONIN I (HIGH SENSITIVITY)    DG Chest 2 View  Final Result      Medications  alum & mag hydroxide-simeth (MAALOX/MYLANTA)  200-200-20 MG/5ML suspension 30 mL (30 mLs Oral Given 08/27/22 0422)     Procedures  /  Critical Care Procedures  ED Course and Medical Decision Making  Initial Impression and Ddx Differential diagnosis includes duodenitis, gastritis, pancreatitis, overall a very low suspicion for dissection and a very low suspicion for PE.  Stated 9 out of 10 pain but actually sitting comfortably with normal vital signs.  Awaiting EKG, chest x-ray, labs.  Past medical/surgical history that increases complexity of ED encounter: None  Interpretation of Diagnostics I personally reviewed the EKG and my interpretation is as follows: Sinus rhythm without concerning ischemic features  Labs reassuring with no significant blood count or electrolyte disturbance, lipase negative, troponin negative.  Chest x-ray normal, normal-appearing mediastinum  Patient Reassessment and Ultimate Disposition/Management     Patient feels a lot better after the Maalox, continues to sit comfortably in no acute distress with normal vital signs, appropriate for discharge.  Patient management required discussion with the following services or consulting groups:  None  Complexity of Problems Addressed Acute illness or injury that poses threat of life of bodily function  Additional Data Reviewed and Analyzed Further history obtained from: None  Additional Factors Impacting ED Encounter Risk Prescriptions  Elmer Sow. Pilar Plate, MD [facility] Emergency Medicine [facility] [email or web address]  Final Clinical Impressions(s) / ED Diagnoses     ICD-10-CM   1. Epigastric pain  R10.13       ED Discharge Orders          Ordered    omeprazole (PRILOSEC) 20 MG capsule  Daily        08/27/22 0503    sucralfate (CARAFATE) 1 g tablet  4 times daily PRN        08/27/22 0503             Discharge Instructions Discussed with and Provided to Patient:     Discharge Instructions      You were evaluated in  the Emergency Department and after careful evaluation, we did not find any emergent condition requiring admission or further testing in the hospital.  Your exam/testing today is overall reassuring.  Symptoms may be due to stomach acid related pain.  Use the omeprazole and Carafate medications as we discussed.  Follow-up with a primary care doctor.  Please return to the Emergency Department if you experience any worsening of your condition.   Thank you for allowing [place] to be a part of your care.       Sabas Sous, MD 08/27/22 [phone]

## 2022-10-18 ENCOUNTER — Ambulatory Visit
Admission: EM | Admit: 2022-10-18 | Discharge: 2022-10-18 | Disposition: A | Discharge status: Home / Self Care | Payer: Medicaid Other | Source: Home / Self Care

## 2022-10-18 DIAGNOSIS — K529 Noninfective gastroenteritis and colitis, unspecified: Secondary | ICD-10-CM

## 2022-10-18 LAB — POCT URINALYSIS DIP (MANUAL ENTRY)
Bilirubin, UA: NEGATIVE
Blood, UA: NEGATIVE
Glucose, UA: NEGATIVE mg/dL
Ketones, POC UA: NEGATIVE mg/dL
Leukocytes, UA: NEGATIVE
Nitrite, UA: NEGATIVE
Spec Grav, UA: >=1.03 — AB (ref 1.010–1.025)
Urobilinogen, UA: 0.2 E.U./dL
pH, UA: 6 (ref 5.0–8.0)

## 2022-10-18 MED ORDER — ONDANSETRON 4 MG PO TBDP: 4.0000 mg | ORAL_TABLET | Freq: Three times a day (TID) | ORAL | 0 refills | Status: DC | PRN

## 2022-10-18 NOTE — ED Triage Notes (Signed)
"  This morning woke up with Abd pain that seems to be more in my ribs/area this morning". Diarrhea/loose stools started "just before, yesterday". Vomiting "today, 3x". Voids "fine/normal". No fever known.

## 2022-10-18 NOTE — ED Provider Notes (Signed)
[facility]    CSN: [id number] Arrival date & time: 10/18/22  0827      History   Chief Complaint Chief Complaint  Patient presents with   Abdominal Pain    HPI Kylie Wallace is a 22 y.o. female.   Patient here today for evaluation of generalized abdominal pain that seems more epigastric and right upper quadrant today.  She has had some diarrhea and loose stools as well as vomiting.  She notes that her son has similar symptoms.  She has not any known fever.  She denies any sore throat or cough.  The history is provided by the patient.  Abdominal Pain Associated symptoms: diarrhea, nausea and vomiting   Associated symptoms: no chills, no cough, no fever, no shortness of breath and no sore throat     Past Medical History:  Diagnosis Date   Medical history non-contributory     There are no problems to display for this patient.   Past Surgical History:  Procedure Laterality Date   WISDOM TOOTH EXTRACTION      OB History     Gravida  2   Para  2   Term  2   Preterm  0   AB  0   Living  2      SAB  0   IAB  0   Ectopic  0   Multiple  0   Live Births  2            Home Medications    Prior to Admission medications   Medication Sig Start Date End Date Taking? Authorizing Provider  Drospirenone (SLYND) 4 MG TABS Take 1 tablet (4 mg total) by mouth daily. 07/10/22  Yes Smith, [place], CNM  ondansetron (ZOFRAN-ODT) 4 MG disintegrating tablet Take 1 tablet (4 mg total) by mouth every 8 (eight) hours as needed. 10/18/22  Yes Tomi Bamberger, PA-C  acetaminophen (TYLENOL) 325 MG tablet Take 2 tablets (650 mg total) by mouth every 4 (four) hours as needed (for pain scale < 4). Patient not taking: Reported on 01/22/2022 11/21/21   Celine Mans, MD  adapalene (DIFFERIN) 0.1 % cream Apply topically at bedtime. Patient not taking: Reported on 01/22/2022 01/03/22   Myrtie Hawk, DO  amoxicillin-clavulanate (AUGMENTIN) 875-125 MG  tablet Take 1 tablet by mouth every 12 (twelve) hours. 03/05/22   Tomi Bamberger, PA-C  ibuprofen (ADVIL) 600 MG tablet Take 1 tablet (600 mg total) by mouth every 6 (six) hours. Patient not taking: Reported on 01/22/2022 11/21/21   Celine Mans, MD  omeprazole (PRILOSEC) 20 MG capsule Take 1 capsule (20 mg total) by mouth daily. 08/27/22   Sabas Sous, MD  Prenatal Vit-Fe Fumarate-FA (PRENATAL PO) Take 1 capsule by mouth daily. Patient not taking: Reported on 01/22/2022    [provider]  sucralfate (CARAFATE) 1 g tablet Take 1 tablet (1 g total) by mouth 4 (four) times daily as needed. 08/27/22   Sabas Sous, MD  norethindrone (MICRONOR) 0.35 MG tablet Take 1 tablet (0.35 mg total) by mouth daily. 11/10/18 01/04/19  Dorathy Kinsman, CNM    Family History Family History  Problem Relation Age of Onset   Hypertension Mother    Healthy Father    Asthma Sister    Diabetes Maternal Grandmother    Diabetes Paternal Grandmother    Birth defects Neg Hx    Cancer Neg Hx    Heart disease Neg Hx    Stroke Neg Hx  Social History Social History   Tobacco Use   Smoking status: Never   Smokeless tobacco: Never  Vaping Use   Vaping status: Never Used  Substance Use Topics   Alcohol use: No   Drug use: No     Allergies   Patient has no known allergies.   Review of Systems Review of Systems  Constitutional:  Negative for chills and fever.  HENT:  Negative for congestion and sore throat.   Eyes:  Negative for discharge and redness.  Respiratory:  Negative for cough and shortness of breath.   Gastrointestinal:  Positive for abdominal pain, diarrhea, nausea and vomiting. Negative for blood in stool.     Physical Exam Triage Vital Signs ED Triage Vitals  Encounter Vitals Group     BP      Systolic BP Percentile      Diastolic BP Percentile      Pulse      Resp      Temp      Temp src      SpO2      Weight      Height      Head Circumference      Peak  Flow      Pain Score      Pain Loc      Pain Education      Exclude from Growth Chart    No data found.  Updated Vital Signs BP (!) 128/90 (BP Location: Right Arm)   Pulse 67   Temp 98.2 F (36.8 C) (Oral)   Resp 18   Ht 5\' 4"  (1.626 m)   Wt 220 lb (99.8 kg)   LMP  (LMP Unknown)   SpO2 99%   BMI 37.76 kg/m      Physical Exam Vitals and nursing note reviewed.  Constitutional:      General: She is not in acute distress.    Appearance: Normal appearance. She is not ill-appearing.  HENT:     Head: Normocephalic and atraumatic.  Eyes:     Conjunctiva/sclera: Conjunctivae normal.  Cardiovascular:     Rate and Rhythm: Normal rate and regular rhythm.     Heart sounds: No murmur heard. Pulmonary:     Effort: Pulmonary effort is normal. No respiratory distress.     Breath sounds: Normal breath sounds. No wheezing, rhonchi or rales.  Abdominal:     General: Abdomen is flat. Bowel sounds are normal. There is no distension.     Palpations: Abdomen is soft.     Tenderness: There is abdominal tenderness (mild TTP to RUQ, epigastric abdomen). There is no guarding or rebound.  Neurological:     Mental Status: She is alert.  Psychiatric:        Mood and Affect: Mood normal.        Behavior: Behavior normal.        Thought Content: Thought content normal.      UC Treatments / Results  Labs (all labs ordered are listed, but only abnormal results are displayed) Labs Reviewed  POCT URINALYSIS DIP (MANUAL ENTRY) - Abnormal; Notable for the following components:      Result Value   Spec Grav, UA >=1.030 (*)    Protein Ur, POC trace (*)    All other components within normal limits    EKG   Radiology No results found.  Procedures Procedures (including critical care time)  Medications Ordered in UC Medications - No data to display  Initial Impression /  Assessment and Plan / UC Course  I have reviewed the triage vital signs and the nursing notes.  Pertinent labs &  imaging results that were available during my care of the patient were reviewed by me and considered in my medical decision making (see chart for details).    Suspect likely viral etiology of symptoms given son is here with similar symptoms.  Did discuss that with right upper quadrant pain there is some concern for possible gallbladder etiology versus pancreatitis.  Recommended further evaluation in the emergency room with any worsening pain as she may need stat imaging and labs.  Will trial Zofran for nausea and encouraged follow-up if no gradual improvement with any further concerns.  Final Clinical Impressions(s) / UC Diagnoses   Final diagnoses:  Gastroenteritis   Discharge Instructions   None    ED Prescriptions     Medication Sig Dispense Auth. Provider   ondansetron (ZOFRAN-ODT) 4 MG disintegrating tablet Take 1 tablet (4 mg total) by mouth every 8 (eight) hours as needed. 20 tablet Tomi Bamberger, PA-C      PDMP not reviewed this encounter.   Tomi Bamberger, PA-C 10/18/22 [phone]

## 2022-11-01 ENCOUNTER — Emergency Department (HOSPITAL_BASED_OUTPATIENT_CLINIC_OR_DEPARTMENT_OTHER)
Admission: EM | Admit: 2022-11-01 | Discharge: 2022-11-01 | Disposition: A | Discharge status: Home / Self Care | Payer: Medicaid Other | Source: Home / Self Care | Attending: Emergency Medicine | Admitting: Emergency Medicine

## 2022-11-01 ENCOUNTER — Emergency Department (HOSPITAL_BASED_OUTPATIENT_CLINIC_OR_DEPARTMENT_OTHER): Payer: Medicaid Other

## 2022-11-01 ENCOUNTER — Other Ambulatory Visit: Payer: Self-pay

## 2022-11-01 DIAGNOSIS — R1013 Epigastric pain: Secondary | ICD-10-CM | POA: Insufficient documentation

## 2022-11-01 DIAGNOSIS — R1011 Right upper quadrant pain: Secondary | ICD-10-CM | POA: Insufficient documentation

## 2022-11-01 DIAGNOSIS — K8020 Calculus of gallbladder without cholecystitis without obstruction: Secondary | ICD-10-CM

## 2022-11-01 LAB — CBC WITH DIFFERENTIAL/PLATELET
Abs Immature Granulocytes: 0.03 10*3/uL (ref 0.00–0.07)
Basophils Absolute: 0 10*3/uL (ref 0.0–0.1)
Basophils Relative: 0 %
Eosinophils Absolute: 0.1 10*3/uL (ref 0.0–0.5)
Eosinophils Relative: 1 %
HCT: 37.9 % (ref 36.0–46.0)
Hemoglobin: 12.4 g/dL (ref 12.0–15.0)
Immature Granulocytes: 0 %
Lymphocytes Relative: 19 %
Lymphs Abs: 2.1 10*3/uL (ref 0.7–4.0)
MCH: 27.7 pg (ref 26.0–34.0)
MCHC: 32.7 g/dL (ref 30.0–36.0)
MCV: 84.8 fL (ref 80.0–100.0)
Monocytes Absolute: 0.4 10*3/uL (ref 0.1–1.0)
Monocytes Relative: 4 %
Neutro Abs: 8.6 10*3/uL — ABNORMAL HIGH (ref 1.7–7.7)
Neutrophils Relative %: 76 %
Platelets: 242 10*3/uL (ref 150–400)
RBC: 4.47 MIL/uL (ref 3.87–5.11)
RDW: 14.1 % (ref 11.5–15.5)
WBC: 11.3 10*3/uL — ABNORMAL HIGH (ref 4.0–10.5)
nRBC: 0 % (ref 0.0–0.2)

## 2022-11-01 LAB — URINALYSIS, ROUTINE W REFLEX MICROSCOPIC
Bilirubin Urine: NEGATIVE
Glucose, UA: NEGATIVE mg/dL
Hgb urine dipstick: NEGATIVE
Ketones, ur: NEGATIVE mg/dL
Nitrite: NEGATIVE
Specific Gravity, Urine: 1.04 — ABNORMAL HIGH (ref 1.005–1.030)
pH: 6 (ref 5.0–8.0)

## 2022-11-01 LAB — COMPREHENSIVE METABOLIC PANEL
ALT: 20 U/L (ref 0–44)
AST: 15 U/L (ref 15–41)
Albumin: 4.4 g/dL (ref 3.5–5.0)
Alkaline Phosphatase: 77 U/L (ref 38–126)
Anion gap: 9 (ref 5–15)
BUN: 20 mg/dL (ref 6–20)
CO2: 25 mmol/L (ref 22–32)
Calcium: 9.3 mg/dL (ref 8.9–10.3)
Chloride: 103 mmol/L (ref 98–111)
Creatinine, Ser: 0.89 mg/dL (ref 0.44–1.00)
GFR, Estimated: >60 mL/min (ref >60)
Glucose, Bld: 129 mg/dL — ABNORMAL HIGH (ref 70–99)
Potassium: 3.9 mmol/L (ref 3.5–5.1)
Sodium: 137 mmol/L (ref 135–145)
Total Bilirubin: 0.3 mg/dL (ref 0.3–1.2)
Total Protein: 7.6 g/dL (ref 6.5–8.1)

## 2022-11-01 LAB — LIPASE, BLOOD: Lipase: 20 U/L (ref 11–51)

## 2022-11-01 LAB — HCG, QUANTITATIVE, PREGNANCY: hCG, Beta Chain, Quant, S: <1 m[IU]/mL (ref <5)

## 2022-11-01 MED ORDER — KETOROLAC TROMETHAMINE 30 MG/ML IJ SOLN
30.0000 mg | Freq: Once | INTRAMUSCULAR | Status: AC
  Administered 2022-11-01: 30 mg via INTRAVENOUS
  Filled 2022-11-01: qty 1

## 2022-11-01 MED ORDER — IOHEXOL 300 MG/ML  SOLN
100.0000 mL | Freq: Once | INTRAMUSCULAR | Status: AC | PRN
  Administered 2022-11-01: 100 mL via INTRAVENOUS

## 2022-11-01 MED ORDER — HYDROCODONE-ACETAMINOPHEN 5-325 MG PO TABS: 1.0000 | ORAL_TABLET | Freq: Four times a day (QID) | ORAL | 0 refills | Status: DC | PRN

## 2022-11-01 MED ORDER — ONDANSETRON HCL 4 MG/2ML IJ SOLN
4.0000 mg | Freq: Once | INTRAMUSCULAR | Status: AC
  Administered 2022-11-01: 4 mg via INTRAVENOUS
  Filled 2022-11-01: qty 2

## 2022-11-01 NOTE — ED Triage Notes (Signed)
POV from home, a&O x 4, GCS 15, amb to room  Pt c/o bilateral upper abd pain that started approx 1 hour ago with associated vomiting. Hx of gerd and takes omeprazole, sts this feels different, seen at [facility] for Gi symptoms a couple weeks ago but sts this feel different.

## 2022-11-01 NOTE — Discharge Instructions (Addendum)
Take hydrocodone as prescribed as needed for pain.  Follow-up with general surgery regarding your gallstones.  The contact information for [facility] surgery has been provided in this discharge summary for you to call and make these arrangements.  Return to the emergency department in the meantime if you develop worsening pain, high fever, bloody stool or vomit, or for other new and concerning symptoms.

## 2022-11-01 NOTE — ED Notes (Signed)
Attempted PIV twice, unsuccessful; pt tolerated well; charge Charity fundraiser and primary RN notified

## 2022-11-01 NOTE — ED Provider Notes (Signed)
[place] EMERGENCY DEPARTMENT AT [facility] Provider Note   CSN: [id number] Arrival date & time: 11/01/22  0405     History  Chief Complaint  Patient presents with   Abdominal Pain    Kylie Wallace is a 22 y.o. female.  Patient is a 22 year old female with history of GERD.  Patient presenting today with complaints of abdominal pain.  She describes a constant pain to her upper abdomen that radiates to the left upper quadrant and right upper quadrant.  This woke her from sleep.  She reports feeling nauseated, but has not vomited.  No diarrhea.  No fevers or chills.  No alleviating factors.  Pain worse with palpation and movement.  The history is provided by the patient.       Home Medications Prior to Admission medications   Medication Sig Start Date End Date Taking? Authorizing Provider  acetaminophen (TYLENOL) 325 MG tablet Take 2 tablets (650 mg total) by mouth every 4 (four) hours as needed (for pain scale < 4). Patient not taking: Reported on 01/22/2022 11/21/21   Celine Mans, MD  adapalene (DIFFERIN) 0.1 % cream Apply topically at bedtime. Patient not taking: Reported on 01/22/2022 01/03/22   Myrtie Hawk, DO  amoxicillin-clavulanate (AUGMENTIN) 875-125 MG tablet Take 1 tablet by mouth every 12 (twelve) hours. 03/05/22   Tomi Bamberger, PA-C  Drospirenone (SLYND) 4 MG TABS Take 1 tablet (4 mg total) by mouth daily. 07/10/22   Katrinka Blazing, [place], CNM  ibuprofen (ADVIL) 600 MG tablet Take 1 tablet (600 mg total) by mouth every 6 (six) hours. Patient not taking: Reported on 01/22/2022 11/21/21   Celine Mans, MD  omeprazole (PRILOSEC) 20 MG capsule Take 1 capsule (20 mg total) by mouth daily. 08/27/22   Sabas Sous, MD  ondansetron (ZOFRAN-ODT) 4 MG disintegrating tablet Take 1 tablet (4 mg total) by mouth every 8 (eight) hours as needed. 10/18/22   Tomi Bamberger, PA-C  Prenatal Vit-Fe Fumarate-FA (PRENATAL PO) Take 1 capsule by mouth daily. Patient  not taking: Reported on 01/22/2022    [provider]  sucralfate (CARAFATE) 1 g tablet Take 1 tablet (1 g total) by mouth 4 (four) times daily as needed. 08/27/22   Sabas Sous, MD  norethindrone (MICRONOR) 0.35 MG tablet Take 1 tablet (0.35 mg total) by mouth daily. 11/10/18 01/04/19  Dorathy Kinsman, CNM      Allergies    Patient has no known allergies.    Review of Systems   Review of Systems  All other systems reviewed and are negative.   Physical Exam Updated Vital Signs BP (!) 149/83   Pulse (!) 58   Temp 97.8 F (36.6 C)   Resp 20   Ht 5\' 4"  (1.626 m)   Wt 99.8 kg   LMP  (LMP Unknown)   SpO2 100%   Breastfeeding No   BMI 37.77 kg/m  Physical Exam Vitals and nursing note reviewed.  Constitutional:      General: She is not in acute distress.    Appearance: She is well-developed. She is not diaphoretic.  HENT:     Head: Normocephalic and atraumatic.  Cardiovascular:     Rate and Rhythm: Normal rate and regular rhythm.     Heart sounds: No murmur heard.    No friction rub. No gallop.  Pulmonary:     Effort: Pulmonary effort is normal. No respiratory distress.     Breath sounds: Normal breath sounds. No wheezing.  Abdominal:  General: Bowel sounds are normal. There is no distension.     Palpations: Abdomen is soft.     Tenderness: There is abdominal tenderness in the epigastric area. There is no right CVA tenderness, left CVA tenderness, guarding or rebound.  Musculoskeletal:        General: Normal range of motion.     Cervical back: Normal range of motion and neck supple.  Skin:    General: Skin is warm and dry.  Neurological:     General: No focal deficit present.     Mental Status: She is alert and oriented to person, place, and time.     ED Results / Procedures / Treatments   Labs (all labs ordered are listed, but only abnormal results are displayed) Labs Reviewed - No data to display  EKG None  Radiology No results  found.  Procedures Procedures    Medications Ordered in ED Medications  ondansetron (ZOFRAN) injection 4 mg (has no administration in time range)  ketorolac (TORADOL) 30 MG/ML injection 30 mg (has no administration in time range)    ED Course/ Medical Decision Making/ A&P  Patient is a 22 year old female with history of GERD presenting with epigastric and right upper quadrant pain.  This started earlier this evening.  Patient arrives here with stable vital signs and is afebrile.  She does have tenderness in the epigastric region.  Workup initiated including CBC, CMP, urinalysis, and lipase.  She has a trivial elevation of her white count but laboratory studies and UA otherwise unremarkable.  Pregnancy test is negative.  CT scan of the abdomen and pelvis obtained showing stones within her gallbladder, however no evidence for cholecystitis and no other evidence for acute intra-abdominal pathology.  Patient has received IV fluids along with Toradol for pain and Zofran for nausea.  She is now feeling significantly improved.  At this point, I see nothing that indicates the need for emergent surgery.  Patient to be discharged with pain medication and follow-up with general surgery.  Strict return precautions given.  Final Clinical Impression(s) / ED Diagnoses Final diagnoses:  None    Rx / DC Orders ED Discharge Orders     None         Geoffery Lyons, MD 11/01/22 [phone]

## 2022-11-04 DIAGNOSIS — R1011 Right upper quadrant pain: Secondary | ICD-10-CM | POA: Insufficient documentation

## 2022-11-04 DIAGNOSIS — R12 Heartburn: Secondary | ICD-10-CM | POA: Insufficient documentation

## 2022-11-04 DIAGNOSIS — E669 Obesity, unspecified: Secondary | ICD-10-CM | POA: Insufficient documentation

## 2022-11-04 DIAGNOSIS — R932 Abnormal findings on diagnostic imaging of liver and biliary tract: Secondary | ICD-10-CM | POA: Insufficient documentation

## 2022-11-05 ENCOUNTER — Other Ambulatory Visit: Payer: Self-pay | Admitting: Surgery

## 2022-11-05 DIAGNOSIS — R932 Abnormal findings on diagnostic imaging of liver and biliary tract: Secondary | ICD-10-CM

## 2022-11-05 DIAGNOSIS — R1011 Right upper quadrant pain: Secondary | ICD-10-CM

## 2022-11-05 DIAGNOSIS — R12 Heartburn: Secondary | ICD-10-CM

## 2022-11-11 ENCOUNTER — Ambulatory Visit
Admission: RE | Admit: 2022-11-11 | Discharge: 2022-11-11 | Disposition: A | Discharge status: Home / Self Care | Payer: Medicaid Other | Source: Ambulatory Visit | Attending: Surgery | Admitting: Surgery

## 2022-11-11 ENCOUNTER — Ambulatory Visit: Payer: Self-pay | Admitting: Surgery

## 2022-11-11 DIAGNOSIS — R12 Heartburn: Secondary | ICD-10-CM

## 2022-11-11 DIAGNOSIS — R1011 Right upper quadrant pain: Secondary | ICD-10-CM

## 2022-11-11 DIAGNOSIS — R932 Abnormal findings on diagnostic imaging of liver and biliary tract: Secondary | ICD-10-CM

## 2022-11-11 DIAGNOSIS — K8010 Calculus of gallbladder with chronic cholecystitis without obstruction: Secondary | ICD-10-CM | POA: Insufficient documentation

## 2022-11-26 ENCOUNTER — Encounter (HOSPITAL_BASED_OUTPATIENT_CLINIC_OR_DEPARTMENT_OTHER): Payer: Self-pay | Admitting: Emergency Medicine

## 2022-11-26 ENCOUNTER — Other Ambulatory Visit: Payer: Self-pay

## 2022-11-26 ENCOUNTER — Emergency Department (HOSPITAL_BASED_OUTPATIENT_CLINIC_OR_DEPARTMENT_OTHER)
Admission: EM | Admit: 2022-11-26 | Discharge: 2022-11-26 | Disposition: A | Discharge status: Home / Self Care | Payer: Medicaid Other | Attending: Emergency Medicine | Admitting: Emergency Medicine

## 2022-11-26 DIAGNOSIS — K8020 Calculus of gallbladder without cholecystitis without obstruction: Secondary | ICD-10-CM | POA: Insufficient documentation

## 2022-11-26 DIAGNOSIS — R109 Unspecified abdominal pain: Secondary | ICD-10-CM | POA: Diagnosis present

## 2022-11-26 LAB — URINALYSIS, ROUTINE W REFLEX MICROSCOPIC
Bilirubin Urine: NEGATIVE
Glucose, UA: NEGATIVE mg/dL
Hgb urine dipstick: NEGATIVE
Ketones, ur: NEGATIVE mg/dL
Nitrite: NEGATIVE
Protein, ur: 30 mg/dL — AB
Specific Gravity, Urine: 1.037 — ABNORMAL HIGH (ref 1.005–1.030)
Squamous Epithelial / HPF: >50 /HPF (ref 0–5)
pH: 6 (ref 5.0–8.0)

## 2022-11-26 LAB — CBC
HCT: 39.3 % (ref 36.0–46.0)
Hemoglobin: 12.8 g/dL (ref 12.0–15.0)
MCH: 28.1 pg (ref 26.0–34.0)
MCHC: 32.6 g/dL (ref 30.0–36.0)
MCV: 86.4 fL (ref 80.0–100.0)
Platelets: 211 10*3/uL (ref 150–400)
RBC: 4.55 MIL/uL (ref 3.87–5.11)
RDW: 14.3 % (ref 11.5–15.5)
WBC: 10.6 10*3/uL — ABNORMAL HIGH (ref 4.0–10.5)
nRBC: 0 % (ref 0.0–0.2)

## 2022-11-26 LAB — COMPREHENSIVE METABOLIC PANEL
ALT: 56 U/L — ABNORMAL HIGH (ref 0–44)
AST: 27 U/L (ref 15–41)
Albumin: 4.5 g/dL (ref 3.5–5.0)
Alkaline Phosphatase: 79 U/L (ref 38–126)
Anion gap: 8 (ref 5–15)
BUN: 15 mg/dL (ref 6–20)
CO2: 25 mmol/L (ref 22–32)
Calcium: 8.8 mg/dL — ABNORMAL LOW (ref 8.9–10.3)
Chloride: 106 mmol/L (ref 98–111)
Creatinine, Ser: 0.87 mg/dL (ref 0.44–1.00)
GFR, Estimated: >60 mL/min (ref >60)
Glucose, Bld: 130 mg/dL — ABNORMAL HIGH (ref 70–99)
Potassium: 3.5 mmol/L (ref 3.5–5.1)
Sodium: 139 mmol/L (ref 135–145)
Total Bilirubin: 0.3 mg/dL (ref 0.3–1.2)
Total Protein: 7.8 g/dL (ref 6.5–8.1)

## 2022-11-26 LAB — PREGNANCY, URINE: Preg Test, Ur: NEGATIVE

## 2022-11-26 LAB — LIPASE, BLOOD: Lipase: 26 U/L (ref 11–51)

## 2022-11-26 MED ORDER — KETOROLAC TROMETHAMINE 15 MG/ML IJ SOLN
15.0000 mg | Freq: Once | INTRAMUSCULAR | Status: AC
  Administered 2022-11-26: 15 mg via INTRAVENOUS
  Filled 2022-11-26: qty 1

## 2022-11-26 MED ORDER — ONDANSETRON HCL 4 MG/2ML IJ SOLN
4.0000 mg | Freq: Once | INTRAMUSCULAR | Status: AC
  Administered 2022-11-26: 4 mg via INTRAVENOUS
  Filled 2022-11-26: qty 2

## 2022-11-26 MED ORDER — MORPHINE SULFATE (PF) 4 MG/ML IV SOLN
4.0000 mg | Freq: Once | INTRAVENOUS | Status: AC
  Administered 2022-11-26: 4 mg via INTRAVENOUS
  Filled 2022-11-26: qty 1

## 2022-11-26 MED ORDER — HYDROCODONE-ACETAMINOPHEN 5-325 MG PO TABS: 1.0000 | ORAL_TABLET | Freq: Four times a day (QID) | ORAL | 0 refills | Status: DC | PRN

## 2022-11-26 MED ORDER — LACTATED RINGERS IV BOLUS
1000.0000 mL | Freq: Once | INTRAVENOUS | Status: AC
  Administered 2022-11-26: 1000 mL via INTRAVENOUS

## 2022-11-26 NOTE — ED Provider Notes (Signed)
[place] EMERGENCY DEPARTMENT AT [facility] Provider Note   CSN: [id number] Arrival date & time: 11/26/22  4696     History  Chief Complaint  Patient presents with   Abdominal Pain   Emesis    Kylie Wallace is a 22 y.o. female.  Patient is a 22 year old female with a history of GERD and recently diagnosed with gallstones who has seen general surgery and plan for cholecystectomy on 12/06/2022 who is presenting today with sudden onset of abdominal pain at 430 this morning that woke her up from sleep.  She reports the pain is in the upper quadrants and radiates into the right side of her back.  She has also had vomiting but denies any diarrhea.  She reports that pain feels similar to her recent prior attacks.  She last had an episode of pain on Saturday but it resolved after she took hydrocodone which they had given her to use as needed if she has pain.  She attempted this medication today but it has not helped with her pain.  She denies any urinary symptoms, cough, congestion, fever or shortness of breath.  The history is provided by the patient.  Abdominal Pain Associated symptoms: vomiting   Emesis Associated symptoms: abdominal pain        Home Medications Prior to Admission medications   Medication Sig Start Date End Date Taking? Authorizing Provider  HYDROcodone-acetaminophen (NORCO/VICODIN) 5-325 MG tablet Take 1 tablet by mouth every 6 (six) hours as needed. 11/26/22  Yes Gwyneth Sprout, MD  acetaminophen (TYLENOL) 325 MG tablet Take 2 tablets (650 mg total) by mouth every 4 (four) hours as needed (for pain scale < 4). Patient not taking: Reported on 01/22/2022 11/21/21   Celine Mans, MD  adapalene (DIFFERIN) 0.1 % cream Apply topically at bedtime. Patient not taking: Reported on 01/22/2022 01/03/22   Myrtie Hawk, DO  amoxicillin-clavulanate (AUGMENTIN) 875-125 MG tablet Take 1 tablet by mouth every 12 (twelve) hours. 03/05/22   Tomi Bamberger,  PA-C  Drospirenone (SLYND) 4 MG TABS Take 1 tablet (4 mg total) by mouth daily. 07/10/22   Katrinka Blazing, [place], CNM  ibuprofen (ADVIL) 600 MG tablet Take 1 tablet (600 mg total) by mouth every 6 (six) hours. Patient not taking: Reported on 01/22/2022 11/21/21   Celine Mans, MD  omeprazole (PRILOSEC) 20 MG capsule Take 1 capsule (20 mg total) by mouth daily. 08/27/22   Sabas Sous, MD  ondansetron (ZOFRAN-ODT) 4 MG disintegrating tablet Take 1 tablet (4 mg total) by mouth every 8 (eight) hours as needed. 10/18/22   Tomi Bamberger, PA-C  Prenatal Vit-Fe Fumarate-FA (PRENATAL PO) Take 1 capsule by mouth daily. Patient not taking: Reported on 01/22/2022    [provider]  sucralfate (CARAFATE) 1 g tablet Take 1 tablet (1 g total) by mouth 4 (four) times daily as needed. 08/27/22   Sabas Sous, MD  norethindrone (MICRONOR) 0.35 MG tablet Take 1 tablet (0.35 mg total) by mouth daily. 11/10/18 01/04/19  Dorathy Kinsman, CNM      Allergies    Patient has no known allergies.    Review of Systems   Review of Systems  Gastrointestinal:  Positive for abdominal pain and vomiting.    Physical Exam Updated Vital Signs BP (!) 130/92 (BP Location: Right Arm)   Pulse (!) 59   Temp 98.4 F (36.9 C)   Resp 19   Ht 5\' 4"  (1.626 m)   Wt 99.8 kg   SpO2 100%  BMI 37.77 kg/m  Physical Exam Vitals and nursing note reviewed.  Constitutional:      General: She is not in acute distress.    Appearance: She is well-developed.  HENT:     Head: Normocephalic and atraumatic.  Eyes:     Pupils: Pupils are equal, round, and reactive to light.  Cardiovascular:     Rate and Rhythm: Normal rate and regular rhythm.     Heart sounds: Normal heart sounds. No murmur heard.    No friction rub.  Pulmonary:     Effort: Pulmonary effort is normal.     Breath sounds: Normal breath sounds. No wheezing or rales.  Abdominal:     General: Bowel sounds are normal. There is no distension.     Palpations:  Abdomen is soft.     Tenderness: There is abdominal tenderness in the right upper quadrant, epigastric area and left upper quadrant. There is no guarding or rebound. Positive signs include Murphy's sign.  Musculoskeletal:        General: No tenderness. Normal range of motion.     Comments: No edema  Skin:    General: Skin is warm and dry.     Findings: No rash.  Neurological:     Mental Status: She is alert and oriented to person, place, and time.     Cranial Nerves: No cranial nerve deficit.  Psychiatric:        Behavior: Behavior normal.     ED Results / Procedures / Treatments   Labs (all labs ordered are listed, but only abnormal results are displayed) Labs Reviewed  COMPREHENSIVE METABOLIC PANEL - Abnormal; Notable for the following components:      Result Value   Glucose, Bld 130 (*)    Calcium 8.8 (*)    ALT 56 (*)    All other components within normal limits  CBC - Abnormal; Notable for the following components:   WBC 10.6 (*)    All other components within normal limits  URINALYSIS, ROUTINE W REFLEX MICROSCOPIC - Abnormal; Notable for the following components:   APPearance HAZY (*)    Specific Gravity, Urine 1.037 (*)    Protein, ur 30 (*)    Leukocytes,Ua TRACE (*)    Bacteria, UA RARE (*)    All other components within normal limits  LIPASE, BLOOD  PREGNANCY, URINE    EKG None  Radiology No results found.  Procedures Procedures    Medications Ordered in ED Medications  lactated ringers bolus 1,000 mL (0 mLs Intravenous Stopped 11/26/22 0916)  ondansetron (ZOFRAN) injection 4 mg (4 mg Intravenous Given 11/26/22 0746)  morphine (PF) 4 MG/ML injection 4 mg (4 mg Intravenous Given 11/26/22 0746)  ketorolac (TORADOL) 15 MG/ML injection 15 mg (15 mg Intravenous Given 11/26/22 0917)  morphine (PF) 4 MG/ML injection 4 mg (4 mg Intravenous Given 11/26/22 8756)    ED Course/ Medical Decision Making/ A&P                                 Medical Decision  Making Amount and/or Complexity of Data Reviewed Labs: ordered. Decision-making details documented in ED Course.  Risk Prescription drug management.   Pt  presenting today with a complaint that caries a high risk for morbidity and mortality.  Here today with recurrent abdominal pain in the setting of known gallstones and scheduled cholecystectomy.  Was not triggered by food but woke up at 4:30  AM with the pain.  She has had vomiting and appears uncomfortable with upper abdominal pain and Murphy sign today.  Will ensure no evidence of transaminitis or pancreatitis that would be concerning for choledocholithiasis at this time.  Patient given pain medication, antiemetics and fluids.  10:05 AM I independently interpreted the labs, CBC, lipase without acute findings today, CMP with normal bilirubin and AST but ALT minimally elevated at 56.  On repeat evaluation patient is still having pain and says only minor improvement with the morphine.  Will give another dose of pain control and reassess.  10:05 AM On reevaluation after second dose of pain medicine patient reports the symptoms are improved.  The pain is not completely gone but she is feeling better and was able to drink water.  Gave the patient the option of calling general surgery and possible admission versus going home continuing bland diet, p.o. meds as needed and waiting for her surgery on the 20th.  Patient would like to go home at this time.  She was given return precautions.  No indication for hospitalization at this time.         Final Clinical Impression(s) / ED Diagnoses Final diagnoses:  Calculus of gallbladder without cholecystitis without obstruction    Rx / DC Orders ED Discharge Orders          Ordered    HYDROcodone-acetaminophen (NORCO/VICODIN) 5-325 MG tablet  Every 6 hours PRN        11/26/22 1005              Gwyneth Sprout, MD 11/26/22 1005

## 2022-11-26 NOTE — ED Triage Notes (Signed)
Pt arrives to ED with c/o nausea and vomiting at 430am. Hx gallstones.

## 2022-11-26 NOTE — Discharge Instructions (Addendum)
Eat a very bland diet avoid any fried or fatty foods as well as raw vegetables.  If you have recurrent pain and the medicine at home is not helping you start having vomiting then return to the emergency room or if you have any fever.

## 2022-11-29 ENCOUNTER — Other Ambulatory Visit: Payer: Self-pay

## 2022-11-29 DIAGNOSIS — K8020 Calculus of gallbladder without cholecystitis without obstruction: Secondary | ICD-10-CM | POA: Insufficient documentation

## 2022-11-29 DIAGNOSIS — R1011 Right upper quadrant pain: Secondary | ICD-10-CM | POA: Diagnosis present

## 2022-11-30 ENCOUNTER — Emergency Department (HOSPITAL_BASED_OUTPATIENT_CLINIC_OR_DEPARTMENT_OTHER)
Admission: EM | Admit: 2022-11-30 | Discharge: 2022-11-30 | Disposition: A | Discharge status: Home / Self Care | Payer: Medicaid Other | Attending: Emergency Medicine | Admitting: Emergency Medicine

## 2022-11-30 ENCOUNTER — Encounter (HOSPITAL_BASED_OUTPATIENT_CLINIC_OR_DEPARTMENT_OTHER): Payer: Self-pay | Admitting: Emergency Medicine

## 2022-11-30 ENCOUNTER — Other Ambulatory Visit: Payer: Self-pay

## 2022-11-30 ENCOUNTER — Emergency Department (HOSPITAL_BASED_OUTPATIENT_CLINIC_OR_DEPARTMENT_OTHER): Payer: Medicaid Other

## 2022-11-30 DIAGNOSIS — K8020 Calculus of gallbladder without cholecystitis without obstruction: Secondary | ICD-10-CM

## 2022-11-30 LAB — COMPREHENSIVE METABOLIC PANEL
ALT: 31 U/L (ref 0–44)
AST: 17 U/L (ref 15–41)
Albumin: 4.6 g/dL (ref 3.5–5.0)
Alkaline Phosphatase: 79 U/L (ref 38–126)
Anion gap: 9 (ref 5–15)
BUN: 14 mg/dL (ref 6–20)
CO2: 25 mmol/L (ref 22–32)
Calcium: 9.4 mg/dL (ref 8.9–10.3)
Chloride: 103 mmol/L (ref 98–111)
Creatinine, Ser: 0.79 mg/dL (ref 0.44–1.00)
GFR, Estimated: >60 mL/min (ref >60)
Glucose, Bld: 112 mg/dL — ABNORMAL HIGH (ref 70–99)
Potassium: 4 mmol/L (ref 3.5–5.1)
Sodium: 137 mmol/L (ref 135–145)
Total Bilirubin: 0.3 mg/dL (ref 0.3–1.2)
Total Protein: 7.8 g/dL (ref 6.5–8.1)

## 2022-11-30 LAB — URINALYSIS, ROUTINE W REFLEX MICROSCOPIC
Bilirubin Urine: NEGATIVE
Glucose, UA: NEGATIVE mg/dL
Hgb urine dipstick: NEGATIVE
Ketones, ur: NEGATIVE mg/dL
Nitrite: NEGATIVE
Protein, ur: 30 mg/dL — AB
Specific Gravity, Urine: 1.029 (ref 1.005–1.030)
pH: 6 (ref 5.0–8.0)

## 2022-11-30 LAB — CBC
HCT: 39.8 % (ref 36.0–46.0)
Hemoglobin: 13 g/dL (ref 12.0–15.0)
MCH: 28 pg (ref 26.0–34.0)
MCHC: 32.7 g/dL (ref 30.0–36.0)
MCV: 85.6 fL (ref 80.0–100.0)
Platelets: 220 10*3/uL (ref 150–400)
RBC: 4.65 MIL/uL (ref 3.87–5.11)
RDW: 14.2 % (ref 11.5–15.5)
WBC: 11.5 10*3/uL — ABNORMAL HIGH (ref 4.0–10.5)
nRBC: 0 % (ref 0.0–0.2)

## 2022-11-30 LAB — LIPASE, BLOOD: Lipase: 30 U/L (ref 11–51)

## 2022-11-30 LAB — PREGNANCY, URINE: Preg Test, Ur: NEGATIVE

## 2022-11-30 MED ORDER — ONDANSETRON HCL 4 MG/2ML IJ SOLN
4.0000 mg | Freq: Once | INTRAMUSCULAR | Status: AC
  Administered 2022-11-30: 4 mg via INTRAVENOUS
  Filled 2022-11-30: qty 2

## 2022-11-30 MED ORDER — OXYCODONE-ACETAMINOPHEN 5-325 MG PO TABS: 1.0000 | ORAL_TABLET | Freq: Three times a day (TID) | ORAL | 0 refills | Status: DC | PRN

## 2022-11-30 MED ORDER — FENTANYL CITRATE PF 50 MCG/ML IJ SOSY
PREFILLED_SYRINGE | INTRAMUSCULAR | Status: AC
  Filled 2022-11-30: qty 1

## 2022-11-30 MED ORDER — FENTANYL CITRATE PF 50 MCG/ML IJ SOSY
50.0000 ug | PREFILLED_SYRINGE | INTRAMUSCULAR | Status: DC | PRN
  Administered 2022-11-30: 50 ug via INTRAVENOUS

## 2022-11-30 MED ORDER — IOHEXOL 300 MG/ML  SOLN
100.0000 mL | Freq: Once | INTRAMUSCULAR | Status: AC | PRN
  Administered 2022-11-30: 100 mL via INTRAVENOUS

## 2022-11-30 MED ORDER — HYDROMORPHONE HCL 1 MG/ML IJ SOLN
1.0000 mg | Freq: Once | INTRAMUSCULAR | Status: AC
  Administered 2022-11-30: 1 mg via INTRAVENOUS
  Filled 2022-11-30: qty 1

## 2022-11-30 MED ORDER — LACTATED RINGERS IV BOLUS
1000.0000 mL | Freq: Once | INTRAVENOUS | Status: AC
  Administered 2022-11-30: 1000 mL via INTRAVENOUS

## 2022-11-30 NOTE — ED Provider Notes (Signed)
[place] EMERGENCY DEPARTMENT AT [facility] Provider Note   CSN: [id number] Arrival date & time: 11/29/22  2359     History  Chief Complaint  Patient presents with   Abdominal Pain    Kylie Wallace is a 22 y.o. female.  22 year old female with known cholelithiasis and biliary colic scheduled for surgery this week that presents the ER today secondary to acute worsening of the right upper quadrant pain and nausea that she had earlier this month.  Patient states that this feels the same.  This happened after eating some cereal.  No significant amount of fatty food recently.  No fevers.  No diarrhea or constipation.  No trauma.   Abdominal Pain      Home Medications Prior to Admission medications   Medication Sig Start Date End Date Taking? Authorizing Provider  oxyCODONE-acetaminophen (PERCOCET) 5-325 MG tablet Take 1-2 tablets by mouth every 8 (eight) hours as needed for severe pain. 11/30/22  Yes Chennel Olivos, Barbara Cower, MD  Drospirenone (SLYND) 4 MG TABS Take 1 tablet (4 mg total) by mouth daily. 07/10/22   Katrinka Blazing, [place], CNM  omeprazole (PRILOSEC) 20 MG capsule Take 1 capsule (20 mg total) by mouth daily. 08/27/22   Sabas Sous, MD  ondansetron (ZOFRAN-ODT) 4 MG disintegrating tablet Take 1 tablet (4 mg total) by mouth every 8 (eight) hours as needed. 10/18/22   Tomi Bamberger, PA-C  sucralfate (CARAFATE) 1 g tablet Take 1 tablet (1 g total) by mouth 4 (four) times daily as needed. 08/27/22   Sabas Sous, MD  norethindrone (MICRONOR) 0.35 MG tablet Take 1 tablet (0.35 mg total) by mouth daily. 11/10/18 01/04/19  Dorathy Kinsman, CNM      Allergies    Patient has no known allergies.    Review of Systems   Review of Systems  Gastrointestinal:  Positive for abdominal pain.    Physical Exam Updated Vital Signs BP 104/60   Pulse (!) 58   Temp 98.2 F (36.8 C) (Oral)   Resp 17   SpO2 100%  Physical Exam Vitals and nursing note reviewed.  Constitutional:       Appearance: She is well-developed.  HENT:     Head: Normocephalic and atraumatic.  Cardiovascular:     Rate and Rhythm: Normal rate and regular rhythm.  Pulmonary:     Effort: No respiratory distress.     Breath sounds: No stridor.  Abdominal:     General: There is no distension.     Tenderness: There is abdominal tenderness in the right upper quadrant and epigastric area.  Musculoskeletal:     Cervical back: Normal range of motion.  Neurological:     Mental Status: She is alert.     ED Results / Procedures / Treatments   Labs (all labs ordered are listed, but only abnormal results are displayed) Labs Reviewed  COMPREHENSIVE METABOLIC PANEL - Abnormal; Notable for the following components:      Result Value   Glucose, Bld 112 (*)    All other components within normal limits  CBC - Abnormal; Notable for the following components:   WBC 11.5 (*)    All other components within normal limits  URINALYSIS, ROUTINE W REFLEX MICROSCOPIC - Abnormal; Notable for the following components:   APPearance HAZY (*)    Protein, ur 30 (*)    Leukocytes,Ua TRACE (*)    Bacteria, UA RARE (*)    All other components within normal limits  LIPASE, BLOOD  PREGNANCY, URINE  EKG None  Radiology CT ABDOMEN PELVIS W CONTRAST  Result Date: 11/30/2022 CLINICAL DATA:  Abdominal pain, history of gallstones. Nausea and vomiting. EXAM: CT ABDOMEN AND PELVIS WITH CONTRAST TECHNIQUE: Multidetector CT imaging of the abdomen and pelvis was performed using the standard protocol following bolus administration of intravenous contrast. RADIATION DOSE REDUCTION: This exam was performed according to the departmental dose-optimization program which includes automated exposure control, adjustment of the mA and/or kV according to patient size and/or use of iterative reconstruction technique. CONTRAST:  OMNIPAQUE IOHEXOL 300 MG/ML  SOLN COMPARISON:  11/01/2022. FINDINGS: Lower chest: No acute abnormality.  Hepatobiliary: There is focal fatty infiltration in the left lobe of the liver adjacent to the falciform ligament. No biliary ductal dilatation. Stones are present within the gallbladder. No focal gallbladder wall thickening or surrounding inflammatory changes are seen. Pancreas: Unremarkable. No pancreatic ductal dilatation or surrounding inflammatory changes. Spleen: Normal in size without focal abnormality. Adrenals/Urinary Tract: The adrenal glands are within normal limits. The kidneys enhance symmetrically. No renal calculus or obstructive uropathy bilaterally. The bladder is unremarkable. Stomach/Bowel: Stomach is within normal limits. Appendix appears normal. No evidence of bowel wall thickening, distention, or inflammatory changes. No free air or pneumatosis. Vascular/Lymphatic: No significant vascular findings are present. No enlarged abdominal or pelvic lymph nodes. Reproductive: Uterus and bilateral adnexa are unremarkable. Other: A stable collection of fluid is noted in the pelvis. Musculoskeletal: There sclerosis at the sacroiliac joints, compatible with sacroiliitis. No acute osseous abnormality. IMPRESSION: 1. Cholelithiasis. 2. Remaining incidental findings are unchanged. Electronically Signed   By: Thornell Sartorius M.D.   On: 11/30/2022 03:31    Procedures Procedures    Medications Ordered in ED Medications  fentaNYL (SUBLIMAZE) injection 50 mcg ( Intravenous Not Given 11/30/22 0254)  HYDROmorphone (DILAUDID) injection 1 mg (1 mg Intravenous Given 11/30/22 0259)  ondansetron (ZOFRAN) injection 4 mg (4 mg Intravenous Given 11/30/22 0259)  lactated ringers bolus 1,000 mL (1,000 mLs Intravenous New Bag/Given 11/30/22 0259)  iohexol (OMNIPAQUE) 300 MG/ML solution 100 mL (100 mLs Intravenous Contrast Given 11/30/22 0312)    ED Course/ Medical Decision Making/ A&P Clinical Course as of 11/30/22 0642  Sat Nov 30, 2022  0228 WBC(!): 11.5 noted [JM]  0228 Bacteria, UA(!): RARE No urinary  symptoms, low leuk est and wbc, doubt UTI [JM]  0229 AST: 17 [JM]  0229 ALT: 31 [JM]  0229 Alkaline Phosphatase: 79 [JM]  0229 Total Bilirubin: 0.3 Unlikely to be obstructed. Will do BUS to ensure.  [JM]    Clinical Course User Index [JM] Keyondre Hepburn, Barbara Cower, MD                                 Medical Decision Making Amount and/or Complexity of Data Reviewed Labs: ordered. Decision-making details documented in ED Course. Radiology: ordered.  Risk Prescription drug management.  Attempted bedside ultrasound but could not confidently visualize the gallbladder.  I think ID did however there is multiple gallstones and could not see well enough to figure out if there was pericholecystic fluid, dilation, gallbladder wall thickening.  She was tender over it.  Will give her pain meds and get a CT scan to evaluate for any acute cholecystitis or other issues that would require surgery tonight versus this week.  Symptomatic control until then.  CT scan reassuring.  No evidence of cholecystitis on it.  Does have cholelithiasis.  Suspect this is just normal biliary colic.  Her  vomiting improved and her pain improved with medications here.  She will follow-up with surgery for further management as scheduled.  Final Clinical Impression(s) / ED Diagnoses Final diagnoses:  Calculus of gallbladder without cholecystitis without obstruction    Rx / DC Orders ED Discharge Orders          Ordered    oxyCODONE-acetaminophen (PERCOCET) 5-325 MG tablet  Every 8 hours PRN        11/30/22 0448              Arion Shankles, Barbara Cower, MD 11/30/22 [id number]

## 2022-11-30 NOTE — ED Triage Notes (Signed)
Pt with abdominal pain, hx of gallstones.  Sx scheduled for 9/20.  Pt states she has had another flare and could not stand the pain and n/v.

## 2022-12-02 ENCOUNTER — Ambulatory Visit (INDEPENDENT_AMBULATORY_CARE_PROVIDER_SITE_OTHER): Payer: Medicaid Other

## 2022-12-02 ENCOUNTER — Other Ambulatory Visit: Payer: Self-pay

## 2022-12-02 VITALS — BP 112/57 | HR 70 | Wt 218.2 lb

## 2022-12-02 DIAGNOSIS — Z23 Encounter for immunization: Secondary | ICD-10-CM | POA: Diagnosis not present

## 2022-12-02 NOTE — Progress Notes (Signed)
[name] here for influenza vaccine. VIS offered to patient, which was declined. Vaccine administered without complication.  Annual exam due January 04, 2023; offered for patient to schedule during check out. She may delay this appointment because she is having gallbladder removed next week. Reviewed 2 remaining refills for [facility] and encouraged pt to follow up for 3 month supply if unable to have annual exam in October.  Marjo Bicker, RN 12/02/2022  3:08 PM

## 2022-12-06 ENCOUNTER — Other Ambulatory Visit: Payer: Self-pay | Admitting: Surgery

## 2022-12-06 HISTORY — PX: LAPAROSCOPIC CHOLECYSTECTOMY: SUR755

## 2022-12-09 LAB — SURGICAL PATHOLOGY

## 2022-12-19 ENCOUNTER — Ambulatory Visit
Admission: EM | Admit: 2022-12-19 | Discharge: 2022-12-19 | Disposition: A | Discharge status: Home / Self Care | Payer: Medicaid Other

## 2022-12-19 ENCOUNTER — Encounter: Payer: Self-pay | Admitting: Physician Assistant

## 2022-12-19 DIAGNOSIS — Z5189 Encounter for other specified aftercare: Secondary | ICD-10-CM

## 2022-12-19 NOTE — ED Provider Notes (Signed)
[facility]    CSN: [id number] Arrival date & time: 12/19/22  0840      History   Chief Complaint Chief Complaint  Patient presents with   Wound Check    Post Surgical    HPI Kylie Wallace is a 22 y.o. female.   Patient here today for evaluation of redness and warmth around the incision made to her umbilicus when she had her gallbladder removed about 2 weeks ago.  She states she is currently taking doxycycline.  She has not had any fever.  There has been no drainage from the wound.  The history is provided by the patient.    Past Medical History:  Diagnosis Date   Medical history non-contributory     Patient Active Problem List   Diagnosis Date Noted   Chronic cholecystitis with calculus 11/11/2022   Obesity, Class II, BMI 35-39.9 11/04/2022   Heartburn 11/04/2022   Bilateral upper abdominal pain 11/04/2022   Abnormal CT scan, gallbladder 11/04/2022    Past Surgical History:  Procedure Laterality Date   WISDOM TOOTH EXTRACTION      OB History     Gravida  2   Para  2   Term  2   Preterm  0   AB  0   Living  2      SAB  0   IAB  0   Ectopic  0   Multiple  0   Live Births  2            Home Medications    Prior to Admission medications   Medication Sig Start Date End Date Taking? Authorizing Provider  BENZOYL PEROXIDE 5 % external wash Apply topically at bedtime. 09/25/22  Yes [provider]  doxycycline (MONODOX) 100 MG capsule Take 1 capsule by mouth 2 (two) times daily. 12/18/22 12/25/22 Yes [provider]  traMADol (ULTRAM) 50 MG tablet Take 50 mg by mouth every 6 (six) hours as needed. 12/06/22  Yes [provider]  Drospirenone (SLYND) 4 MG TABS Take 1 tablet (4 mg total) by mouth daily. 07/10/22   Katrinka Blazing, [place], CNM  norethindrone (MICRONOR) 0.35 MG tablet Take 1 tablet (0.35 mg total) by mouth daily. 11/10/18 01/04/19  Dorathy Kinsman, CNM    Family History Family History  Problem  Relation Age of Onset   Hypertension Mother    Healthy Father    Asthma Sister    Diabetes Maternal Grandmother    Diabetes Paternal Grandmother    Birth defects Neg Hx    Cancer Neg Hx    Heart disease Neg Hx    Stroke Neg Hx     Social History Social History   Tobacco Use   Smoking status: Never   Smokeless tobacco: Never  Vaping Use   Vaping status: Never Used  Substance Use Topics   Alcohol use: No   Drug use: No     Allergies   Patient has no known allergies.   Review of Systems Review of Systems  Constitutional:  Negative for chills and fever.  Eyes:  Negative for discharge and redness.  Respiratory:  Negative for shortness of breath.   Gastrointestinal:  Negative for abdominal pain, nausea and vomiting.  Skin:  Positive for color change and wound.     Physical Exam Triage Vital Signs ED Triage Vitals  Encounter Vitals Group     BP      Systolic BP Percentile      Diastolic BP Percentile  Pulse      Resp      Temp      Temp src      SpO2      Weight      Height      Head Circumference      Peak Flow      Pain Score      Pain Loc      Pain Education      Exclude from Growth Chart    No data found.  Updated Vital Signs BP 97/66 (BP Location: Left Arm)   Pulse 77   Temp 98.3 F (36.8 C) (Oral)   Resp 18   Ht 5\' 5"  (1.651 m)   Wt 220 lb (99.8 kg)   LMP  (LMP Unknown) Comment: Not taking right now, but I will be again. As of 12-19-2022  SpO2 99%   BMI 36.61 kg/m      Physical Exam Vitals and nursing note reviewed.  Constitutional:      General: She is not in acute distress.    Appearance: Normal appearance. She is not ill-appearing.  HENT:     Head: Normocephalic and atraumatic.  Eyes:     Conjunctiva/sclera: Conjunctivae normal.  Cardiovascular:     Rate and Rhythm: Normal rate.  Pulmonary:     Effort: Pulmonary effort is normal. No respiratory distress.  Skin:    Comments: Mild erythema and warmth around umbilicus.   Surgical wound appears to be healing well, no drainage.  Neurological:     Mental Status: She is alert.  Psychiatric:        Mood and Affect: Mood normal.        Behavior: Behavior normal.        Thought Content: Thought content normal.      UC Treatments / Results  Labs (all labs ordered are listed, but only abnormal results are displayed) Labs Reviewed - No data to display  EKG   Radiology No results found.  Procedures Procedures (including critical care time)  Medications Ordered in UC Medications - No data to display  Initial Impression / Assessment and Plan / UC Course  I have reviewed the triage vital signs and the nursing notes.  Pertinent labs & imaging results that were available during my care of the patient were reviewed by me and considered in my medical decision making (see chart for details).    Reassured patient that doxycycline should cover any possible wound infection and advised to continue to monitor.  Mom does note that they have been using topical Neosporin and recommended she discontinue this in the event that patient actually has allergy to same.  Encouraged she keep wound clean with soap and water and follow-up with any worsening symptoms or failure to improve.  Final Clinical Impressions(s) / UC Diagnoses   Final diagnoses:  Visit for wound check   Discharge Instructions   None    ED Prescriptions   None    PDMP not reviewed this encounter.   Tomi Bamberger, PA-C 12/19/22 [phone]

## 2022-12-19 NOTE — ED Triage Notes (Signed)
"  I had my gallbladder removed, I have an incision on my belly button, the incision areas is red, hot and itchy, no drainage". Date of surgery; 12-06-2022 (@ [place]). No fever.

## 2022-12-20 ENCOUNTER — Emergency Department (HOSPITAL_BASED_OUTPATIENT_CLINIC_OR_DEPARTMENT_OTHER)
Admission: EM | Admit: 2022-12-20 | Discharge: 2022-12-20 | Disposition: A | Discharge status: Home / Self Care | Payer: Medicaid Other | Attending: Emergency Medicine | Admitting: Emergency Medicine

## 2022-12-20 ENCOUNTER — Encounter (HOSPITAL_BASED_OUTPATIENT_CLINIC_OR_DEPARTMENT_OTHER): Payer: Self-pay | Admitting: Emergency Medicine

## 2022-12-20 ENCOUNTER — Other Ambulatory Visit: Payer: Self-pay

## 2022-12-20 ENCOUNTER — Ambulatory Visit
Admission: EM | Admit: 2022-12-20 | Discharge: 2022-12-20 | Disposition: A | Discharge status: Home / Self Care | Payer: Medicaid Other

## 2022-12-20 DIAGNOSIS — T8149XA Infection following a procedure, other surgical site, initial encounter: Secondary | ICD-10-CM

## 2022-12-20 DIAGNOSIS — S301XXA Contusion of abdominal wall, initial encounter: Secondary | ICD-10-CM

## 2022-12-20 DIAGNOSIS — K91872 Postprocedural seroma of a digestive system organ or structure following a digestive system procedure: Secondary | ICD-10-CM | POA: Insufficient documentation

## 2022-12-20 NOTE — ED Provider Notes (Addendum)
[facility]    CSN: [id number] Arrival date & time: 12/20/22  4098      History   Chief Complaint Chief Complaint  Patient presents with   Wound Check    HPI Kylie Wallace is a 22 y.o. female.   Patient presents for wound check of the surgical site.  Patient states that she had her gallbladder removed laparoscopically on 12/06/2022.  She presented yesterday to the urgent care for wound check but was told that it appeared to be healing well.  She states that she woke up this morning with copious amounts of purulent drainage on her shirt, and her incision site draining at her umbilicus.  Denies any obvious fever or chills.  She is currently taking doxycycline prescribed by surgeon.   Wound Check    Past Medical History:  Diagnosis Date   Medical history non-contributory     Patient Active Problem List   Diagnosis Date Noted   Chronic cholecystitis with calculus 11/11/2022   Obesity, Class II, BMI 35-39.9 11/04/2022   Heartburn 11/04/2022   Bilateral upper abdominal pain 11/04/2022   Abnormal CT scan, gallbladder 11/04/2022    Past Surgical History:  Procedure Laterality Date   LAPAROSCOPIC CHOLECYSTECTOMY  12/06/2022   WISDOM TOOTH EXTRACTION      OB History     Gravida  2   Para  2   Term  2   Preterm  0   AB  0   Living  2      SAB  0   IAB  0   Ectopic  0   Multiple  0   Live Births  2            Home Medications    Prior to Admission medications   Medication Sig Start Date End Date Taking? Authorizing Provider  BENZOYL PEROXIDE 5 % external wash Apply topically at bedtime. 09/25/22   [provider]  doxycycline (MONODOX) 100 MG capsule Take 1 capsule by mouth 2 (two) times daily. 12/18/22 12/25/22  [provider]  Drospirenone (SLYND) 4 MG TABS Take 1 tablet (4 mg total) by mouth daily. 07/10/22   Katrinka Blazing, [place], CNM  traMADol (ULTRAM) 50 MG tablet Take 50 mg by mouth every 6 (six) hours as needed.  12/06/22   [provider]  norethindrone (MICRONOR) 0.35 MG tablet Take 1 tablet (0.35 mg total) by mouth daily. 11/10/18 01/04/19  Dorathy Kinsman, CNM    Family History Family History  Problem Relation Age of Onset   Hypertension Mother    Healthy Father    Asthma Sister    Diabetes Maternal Grandmother    Diabetes Paternal Grandmother    Birth defects Neg Hx    Cancer Neg Hx    Heart disease Neg Hx    Stroke Neg Hx     Social History Social History   Tobacco Use   Smoking status: Never   Smokeless tobacco: Never  Vaping Use   Vaping status: Never Used  Substance Use Topics   Alcohol use: No   Drug use: No     Allergies   Patient has no known allergies.   Review of Systems Review of Systems Per HPI  Physical Exam Triage Vital Signs ED Triage Vitals [12/20/22 0911]  Encounter Vitals Group     BP 111/73     Systolic BP Percentile      Diastolic BP Percentile      Pulse Rate 68  Resp 16     Temp 98 F (36.7 C)     Temp Source Oral     SpO2 98 %     Weight      Height      Head Circumference      Peak Flow      Pain Score 0     Pain Loc      Pain Education      Exclude from Growth Chart    No data found.  Updated Vital Signs BP 111/73 (BP Location: Left Arm)   Pulse 68   Temp 98 F (36.7 C) (Oral)   Resp 16   LMP 12/13/2022 (Approximate) Comment: Not taking right now, but I will be again. As of 12-19-2022  SpO2 98%   Breastfeeding No   Visual Acuity Right Eye Distance:   Left Eye Distance:   Bilateral Distance:    Right Eye Near:   Left Eye Near:    Bilateral Near:     Physical Exam Constitutional:      General: She is not in acute distress.    Appearance: Normal appearance. She is not toxic-appearing or diaphoretic.  HENT:     Head: Normocephalic and atraumatic.  Eyes:     Extraocular Movements: Extraocular movements intact.     Conjunctiva/sclera: Conjunctivae normal.  Pulmonary:     Effort: Pulmonary effort is  normal.  Skin:    Comments: Patient has copious amounts of purulent drainage coming from umbilicus surgical site.  Mild erythema directly above umbilicus as well.  Neurological:     General: No focal deficit present.     Mental Status: She is alert and oriented to person, place, and time. Mental status is at baseline.  Psychiatric:        Mood and Affect: Mood normal.        Behavior: Behavior normal.        Thought Content: Thought content normal.        Judgment: Judgment normal.      UC Treatments / Results  Labs (all labs ordered are listed, but only abnormal results are displayed) Labs Reviewed - No data to display  EKG   Radiology No results found.  Procedures Procedures (including critical care time)  Medications Ordered in UC Medications - No data to display  Initial Impression / Assessment and Plan / UC Course  I have reviewed the triage vital signs and the nursing notes.  Pertinent labs & imaging results that were available during my care of the patient were reviewed by me and considered in my medical decision making (see chart for details).     It appears the patient's surgical site is infected.  Given appearance on physical exam, recommended that she go to the emergency department today for further evaluation and management.  She was agreeable with plan.  Vital signs stable at discharge.  Agree with patient self transport to the ER. Final Clinical Impressions(s) / UC Diagnoses   Final diagnoses:  Infected surgical wound   Discharge Instructions   None    ED Prescriptions   None    PDMP not reviewed this encounter.   Gustavus Bryant, [place] 12/20/22 0933    Gustavus Bryant, [place] 12/20/22 [phone]

## 2022-12-20 NOTE — ED Provider Notes (Signed)
[place] EMERGENCY DEPARTMENT AT [facility] Provider Note   CSN: [id number] Arrival date & time: 12/20/22  4098     History  Chief Complaint  Patient presents with   Post-op Problem    Kylie Wallace is a 22 y.o. female.  Patient with history of laparoscopic cholecystectomy with Dr. Michaell Cowing on 9/20 presents today with complaints of post op problem. She states that she had not had any complications from her procedure until 3 days ago when she had some redness around the site. She reached out to her surgeon who placed her on doxycycline which she is currently on day 3 of. She states that the redness has been improving. She had not had any drainage from the incision site until this morning when she woke up this morning and noted that her shirt had some drainage on it from her incision site. She went to urgent care and she was sent here for evaluation. She has not reached out to her surgeon today. She denies any pain around her incision site at all. No fevers, chills, nausea, vomiting, or diarrhea. She has been cleaning the area with soap and water daily.  The history is provided by the patient. No language interpreter was used.       Home Medications Prior to Admission medications   Medication Sig Start Date End Date Taking? Authorizing Provider  BENZOYL PEROXIDE 5 % external wash Apply topically at bedtime. 09/25/22   [provider]  doxycycline (MONODOX) 100 MG capsule Take 1 capsule by mouth 2 (two) times daily. 12/18/22 12/25/22  [provider]  Drospirenone (SLYND) 4 MG TABS Take 1 tablet (4 mg total) by mouth daily. 07/10/22   Katrinka Blazing, [place], CNM  traMADol (ULTRAM) 50 MG tablet Take 50 mg by mouth every 6 (six) hours as needed. 12/06/22   [provider]  norethindrone (MICRONOR) 0.35 MG tablet Take 1 tablet (0.35 mg total) by mouth daily. 11/10/18 01/04/19  Dorathy Kinsman, CNM      Allergies    Patient has no known allergies.    Review of  Systems   Review of Systems  Skin:  Positive for wound.  All other systems reviewed and are negative.   Physical Exam Updated Vital Signs BP 110/76   Pulse 76   Temp 98.4 F (36.9 C) (Oral)   Resp 16   Wt 99.8 kg   LMP 12/13/2022 (Approximate) Comment: Not taking right now, but I will be again. As of 12-19-2022  SpO2 100%   BMI 36.61 kg/m  Physical Exam Vitals and nursing note reviewed.  Constitutional:      General: She is not in acute distress.    Appearance: Normal appearance. She is normal weight. She is not ill-appearing, toxic-appearing or diaphoretic.  HENT:     Head: Normocephalic and atraumatic.  Cardiovascular:     Rate and Rhythm: Normal rate.  Pulmonary:     Effort: Pulmonary effort is normal. No respiratory distress.  Abdominal:     General: Abdomen is flat.     Palpations: Abdomen is soft.     Tenderness: There is no abdominal tenderness.     Comments: 1 cm x 2 cm area of slight redness without cellulitic changes or significant warmth superior to the umbilicus. No fluctuance or induration.  Upon evaluation with a Q-tip the area is draining serous appearing fluid.  No purulence.  No significant tenderness to palpation.  See images below for further.  Musculoskeletal:  General: Normal range of motion.     Cervical back: Normal range of motion.  Skin:    General: Skin is warm and dry.  Neurological:     General: No focal deficit present.     Mental Status: She is alert.  Psychiatric:        Mood and Affect: Mood normal.        Behavior: Behavior normal.     ED Results / Procedures / Treatments   Labs (all labs ordered are listed, but only abnormal results are displayed) Labs Reviewed - No data to display  EKG None  Radiology No results found.  Procedures Procedures    Medications Ordered in ED Medications - No data to display  ED Course/ Medical Decision Making/ A&P                                 Medical Decision Making  Patient  presents today with complaints of post op problem.  She is afebrile, nontoxic-appearing, and in no acute distress with reassuring vital signs.  Physical exam reveals well-appearing patient with abdomen soft and nontender.  Area of slight erythema present superior to the umbilicus without significant cellulitic changes, warmth, fluctuance, or induration.  Upon evaluation with a Q-tip within the umbilicus it appears that there is a small area draining serous appearing fluid without any purulence.  Attempted to probe and deloculated the area with some production of additional serous appearing fluid without purulence.  See images above for further.  Patient does also have pictures with her of the area a few days ago and it appears improved from previous since she started the doxycycline.  Given that patient is pain-free without infectious signs or vital sign abnormalities and is low risk age category with no comorbid conditions and is not immunocompromised, no indication for further evaluation with labs or CT imaging.  The area does not appear infectious and there are no signs of a developing abscess.  Recommend patient complete the course of doxycycline and call her surgeon to schedule follow-up evaluation and get additional recommendations. Evaluation and diagnostic testing in the emergency department does not suggest an emergent condition requiring admission or immediate intervention beyond what has been performed at this time.  Plan for discharge with close PCP follow-up.  Patient is understanding and amenable with plan, educated on red flag symptoms that would prompt immediate return.  Patient discharged in stable condition.  Final Clinical Impression(s) / ED Diagnoses Final diagnoses:  Abdominal wall seroma, initial encounter    Rx / DC Orders ED Discharge Orders     None     An After Visit Summary was printed and given to the patient.     Vear Clock 12/20/22 1038    Linwood Dibbles,  MD 12/23/22 [phone]

## 2022-12-20 NOTE — ED Notes (Signed)
Patient is being discharged from the Urgent Care and sent to the Emergency Department via private vehicle . Per Laren Everts NP, patient is in need of higher level of care due to draining surgical site. Patient is aware and verbalizes understanding of plan of care.  Vitals:   12/20/22 0911  BP: 111/73  Pulse: 68  Resp: 16  Temp: 98 F (36.7 C)  SpO2: 98%

## 2022-12-20 NOTE — Discharge Instructions (Signed)
As we discussed, your incision site does not appear infected. The drainage is likely from what is known as a seroma or an area of serous fluid that is common after surgery and does take some time after a procedure to begin draining. It will likely continue to drain over the next few days. I recommend continuing to clean the area with soap and water.  You can use a warm compress to help facilitate additional drainage.  I also recommend using a Q-tip in your bellybutton to help clean out the area and facilitate drainage.  Please complete the course of doxycycline that you have been taking.  Please call your surgeon at your earliest convenience to discuss additional management and facilitate an earlier follow-up appointment.  Return if development of any new or worsening symptoms.

## 2022-12-20 NOTE — ED Notes (Signed)
Pt given discharge instructions. Opportunities given for questions. Pt verbalizes understanding. Stone,Heather R, RN 

## 2022-12-20 NOTE — ED Triage Notes (Signed)
Pt states she had her gallbladder removed 2 weeks ago. States this morning she started having drainage from her belly button incision . Pt was seen here yesterday for redness to the same area.  States it wasn't draining yesterday.

## 2022-12-20 NOTE — ED Triage Notes (Signed)
Pt c/o drainage from umbilicus incision last night. Recent cholecystectomy

## 2022-12-23 ENCOUNTER — Ambulatory Visit: Payer: Medicaid Other | Admitting: Obstetrics and Gynecology

## 2022-12-26 ENCOUNTER — Encounter (HOSPITAL_BASED_OUTPATIENT_CLINIC_OR_DEPARTMENT_OTHER): Payer: Self-pay | Admitting: Emergency Medicine

## 2022-12-26 ENCOUNTER — Other Ambulatory Visit: Payer: Self-pay

## 2022-12-26 ENCOUNTER — Emergency Department (HOSPITAL_BASED_OUTPATIENT_CLINIC_OR_DEPARTMENT_OTHER)
Admission: EM | Admit: 2022-12-26 | Discharge: 2022-12-26 | Disposition: A | Discharge status: Home / Self Care | Payer: Medicaid Other | Attending: Emergency Medicine | Admitting: Emergency Medicine

## 2022-12-26 DIAGNOSIS — R1110 Vomiting, unspecified: Secondary | ICD-10-CM | POA: Insufficient documentation

## 2022-12-26 DIAGNOSIS — R42 Dizziness and giddiness: Secondary | ICD-10-CM | POA: Insufficient documentation

## 2022-12-26 MED ORDER — MECLIZINE HCL 25 MG PO TABS
25.0000 mg | ORAL_TABLET | Freq: Once | ORAL | Status: AC
  Administered 2022-12-26: 25 mg via ORAL
  Filled 2022-12-26: qty 1

## 2022-12-26 MED ORDER — MECLIZINE HCL 25 MG PO TABS
25.0000 mg | ORAL_TABLET | Freq: Three times a day (TID) | ORAL | 0 refills | Status: AC | PRN
Start: 2022-12-26 — End: ?

## 2022-12-26 MED ORDER — ONDANSETRON 4 MG PO TBDP: 4.0000 mg | ORAL_TABLET | Freq: Three times a day (TID) | ORAL | 0 refills | Status: DC | PRN

## 2022-12-26 MED ORDER — SCOPOLAMINE 1 MG/3DAYS TD PT72: 1.0000 | MEDICATED_PATCH | TRANSDERMAL | 12 refills | Status: DC | PRN

## 2022-12-26 NOTE — ED Notes (Signed)
Dc instructions reviewed with patient. Patient voiced understanding. Dc with belongings.  °

## 2022-12-26 NOTE — ED Provider Notes (Signed)
[place] EMERGENCY DEPARTMENT AT [facility] Provider Note   CSN: [id number] Arrival date & time: 12/26/22  1022     History  No chief complaint on file.   Kylie Wallace is a 22 y.o. female.  HPI   22 year old female presents emergency department with complaints of dizziness.  Dizziness described as room spinning type sensation.  Patient states that she had symptoms last night when she bent down to pick up an object.  States that she had an episode of vomiting when symptomatic.  States that she sat still for a few minutes and symptoms resolved.  Additionally, had episode earlier today when she bent over near a heater in her house and similar symptoms occurred which resolved after a few minutes of sitting still.  Denies history of similar symptoms.  Cousin at bedside states that multiple family numbers have been diagnosed with peripheral vertigo and have benefited well from scopolamine patch.  Patient denies any visual disturbance, difficulty speaking/swallowing, gait abnormalities, facial droop, weakness/sensory deficits in upper lower extremities.  Denies ringing in ears, fever, new medications besides recently being started on doxycycline.  Patient denies any symptoms at this time.  Past medical history significant for obesity, GERD  Home Medications Prior to Admission medications   Medication Sig Start Date End Date Taking? Authorizing Provider  meclizine (ANTIVERT) 25 MG tablet Take 1 tablet (25 mg total) by mouth 3 (three) times daily as needed for dizziness. 12/26/22  Yes Sherian Maroon A, PA  scopolamine (TRANSDERM-SCOP) 1 MG/3DAYS Place 1 patch (1.5 mg total) onto the skin every three (3) days as needed (dizziness). 12/26/22  Yes Sherian Maroon A, PA  BENZOYL PEROXIDE 5 % external wash Apply topically at bedtime. 09/25/22   [provider]  Drospirenone (SLYND) 4 MG TABS Take 1 tablet (4 mg total) by mouth daily. 07/10/22   Katrinka Blazing, [place], CNM  traMADol  (ULTRAM) 50 MG tablet Take 50 mg by mouth every 6 (six) hours as needed. 12/06/22   [provider]  norethindrone (MICRONOR) 0.35 MG tablet Take 1 tablet (0.35 mg total) by mouth daily. 11/10/18 01/04/19  Dorathy Kinsman, CNM      Allergies    Patient has no known allergies.    Review of Systems   Review of Systems  All other systems reviewed and are negative.   Physical Exam Updated Vital Signs BP 110/72   Pulse 66   Temp 98.4 F (36.9 C) (Oral)   Resp 16   LMP 12/13/2022 (Approximate) Comment: Not taking right now, but I will be again. As of 12-19-2022  SpO2 100%  Physical Exam Vitals and nursing note reviewed.  Constitutional:      General: She is not in acute distress.    Appearance: She is well-developed.  HENT:     Head: Normocephalic and atraumatic.     Right Ear: Tympanic membrane normal.     Left Ear: Tympanic membrane normal.  Eyes:     Conjunctiva/sclera: Conjunctivae normal.  Cardiovascular:     Rate and Rhythm: Normal rate and regular rhythm.     Heart sounds: No murmur heard. Pulmonary:     Effort: Pulmonary effort is normal. No respiratory distress.     Breath sounds: Normal breath sounds. No wheezing, rhonchi or rales.  Abdominal:     Palpations: Abdomen is soft.     Tenderness: There is no abdominal tenderness.  Musculoskeletal:        General: No swelling.     Cervical back: Neck  supple.  Skin:    General: Skin is warm and dry.     Capillary Refill: Capillary refill takes less than 2 seconds.  Neurological:     Mental Status: She is alert.     Comments: Alert and oriented to self, place, time and event.   Speech is fluent, clear without dysarthria or dysphasia.   Strength 5/5 in upper/lower extremities   Sensation intact in upper/lower extremities   Normal gait.  Negative Romberg. No pronator drift.  Normal finger-to-nose and feet tapping.  CN I not tested  CN II not tested CN III, IV, VI PERRLA and EOMs intact bilaterally.   Rightward beating horizontal nystagmus on EOMs.   CN V Intact sensation to sharp and light touch to the face  CN VII facial movements symmetric  CN VIII not tested  CN IX, X no uvula deviation, symmetric rise of soft palate  CN XI 5/5 SCM and trapezius strength bilaterally  CN XII Midline tongue protrusion, symmetric L/R movements     Psychiatric:        Mood and Affect: Mood normal.     ED Results / Procedures / Treatments   Labs (all labs ordered are listed, but only abnormal results are displayed) Labs Reviewed - No data to display  EKG None  Radiology No results found.  Procedures Procedures    Medications Ordered in ED Medications  meclizine (ANTIVERT) tablet 25 mg (25 mg Oral Given 12/26/22 1050)    ED Course/ Medical Decision Making/ A&P                                 Medical Decision Making Risk Prescription drug management.   This patient presents to the ED for concern of dizziness, this involves an extensive number of treatment options, and is a complaint that carries with it a high risk of complications and morbidity.  The differential diagnosis includes BPPV, Mnire's disease, labyrinthitis this, acoustic neuroma, perforated TM, medication side effect, CVA, vertebrobasilar insufficiency, other   Co morbidities that complicate the patient evaluation  See HPI   Additional history obtained:  Additional history obtained from EMR External records from outside source obtained and reviewed including hospital records   Lab Tests:  N/a   Imaging Studies ordered:  N/a   Cardiac Monitoring: / EKG:  The patient was maintained on a cardiac monitor.  I personally viewed and interpreted the cardiac monitored which showed an underlying rhythm of: Sinus rhythm   Consultations Obtained:  N/a   Problem List / ED Course / Critical interventions / Medication management  Dizziness I ordered medication including meclizine   Reevaluation of the  patient after these medicines showed that the patient improved I have reviewed the patients home medicines and have made adjustments as needed   Social Determinants of Health:  Denies tobacco, illicit drug use.   Test / Admission - Considered:  Dizziness Vitals signs within normal range and stable throughout visit. 22 year old female presents emergency department with dizziness described as room spinning type sensation worsened with positional changes of the head.  Symptoms resolved after few minutes of sitting sedentary early.  No personal history of similar symptoms in the past but with extensive family history per family member at bedside.  Patient without acute neurodeficit on exam with normal cerebellar functioning.  Patient with reproducible rightward beating horizontal nystagmus which is fatigable after a minute or so.  Patient symptoms most consistent  with peripheral vertigo.  Patient will be sent home with meclizine to use as needed and information for follow-up with ENT.  Further workup deemed unnecessary at this time while in ED.  Treatment plan discussed at length with patient and she acknowledged understanding was agreeable to said plan.  Patient overall well-appearing, afebrile in no acute distress. Worrisome signs and symptoms were discussed with the patient, and the patient acknowledged understanding to return to the ED if noticed. Patient was stable upon discharge.          Final Clinical Impression(s) / ED Diagnoses Final diagnoses:  Dizziness    Rx / DC Orders ED Discharge Orders          Ordered    meclizine (ANTIVERT) 25 MG tablet  3 times daily PRN        12/26/22 1045                   Peter Garter, [place] 12/26/22 1053    Eber Hong, MD 01/03/23 [phone]

## 2022-12-26 NOTE — ED Triage Notes (Signed)
Complains of room spinning/feeling dizzy when lying ,and when she looks down. No other symptoms. No congestion/fever /illness. Had gall bladder removed 3 weeks without complications.

## 2022-12-26 NOTE — Discharge Instructions (Addendum)
As discussed, symptoms most likely secondary to peripheral cause of vertigo.  Will send you home with meclizine as well as scopolamine patch to use as needed for dizziness.  They are the same family of medication so do not use both of them at the same time.  Also sent home with nausea medicine to use as needed.  Recommend follow-up with ENT in the outpatient setting for reassessment of symptoms.  Please do not hesitate to return to emergency department for worrisome signs and symptoms we discussed become apparent.

## 2023-01-08 ENCOUNTER — Encounter (INDEPENDENT_AMBULATORY_CARE_PROVIDER_SITE_OTHER): Payer: Self-pay | Admitting: Otolaryngology

## 2023-01-25 ENCOUNTER — Other Ambulatory Visit: Payer: Self-pay | Admitting: Advanced Practice Midwife

## 2023-01-25 DIAGNOSIS — Z3041 Encounter for surveillance of contraceptive pills: Secondary | ICD-10-CM

## 2023-01-28 ENCOUNTER — Other Ambulatory Visit: Payer: Self-pay | Admitting: Lactation Services

## 2023-01-28 ENCOUNTER — Encounter: Payer: Self-pay | Admitting: Lactation Services

## 2023-01-28 DIAGNOSIS — Z3041 Encounter for surveillance of contraceptive pills: Secondary | ICD-10-CM

## 2023-01-28 MED ORDER — SLYND 4 MG PO TABS: 1.0000 | ORAL_TABLET | Freq: Every day | ORAL | 2 refills | Status: DC

## 2023-01-28 NOTE — Progress Notes (Signed)
Reorderes Slynd for 3 months. Patient due for annual examination. Will send My Chart message to her today.

## 2023-04-20 ENCOUNTER — Other Ambulatory Visit: Payer: Self-pay | Admitting: Advanced Practice Midwife

## 2023-04-20 DIAGNOSIS — Z3041 Encounter for surveillance of contraceptive pills: Secondary | ICD-10-CM

## 2023-05-02 ENCOUNTER — Other Ambulatory Visit: Payer: Self-pay | Admitting: Lactation Services

## 2023-05-02 DIAGNOSIS — Z3041 Encounter for surveillance of contraceptive pills: Secondary | ICD-10-CM

## 2023-05-19 ENCOUNTER — Other Ambulatory Visit: Payer: Self-pay | Admitting: Advanced Practice Midwife

## 2023-05-19 DIAGNOSIS — Z3041 Encounter for surveillance of contraceptive pills: Secondary | ICD-10-CM

## 2023-05-19 MED ORDER — SLYND 4 MG PO TABS: 1.0000 | ORAL_TABLET | Freq: Every day | ORAL | 2 refills | Status: DC

## 2023-07-19 IMAGING — US US MFM OB DETAIL+14 WK
1 series · 13 of 28 positions shown · non-contrast
Comparison: none

[Series 1: us mfm ob detail+14 wk · 13 of 86 slices shown]
[im 4/86]
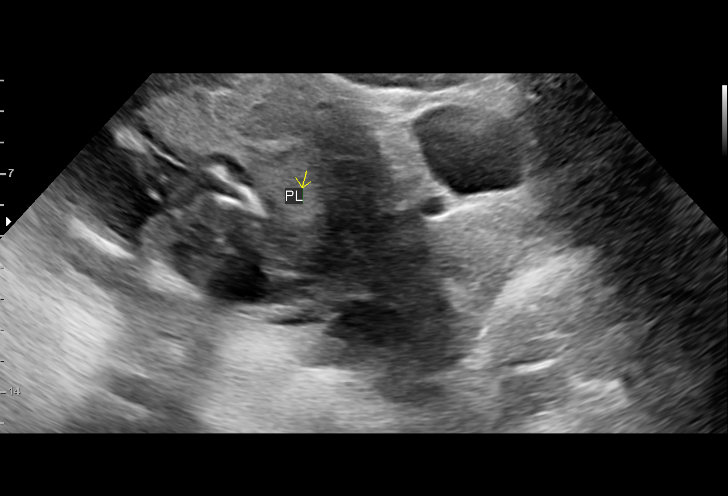
[im 10/86]
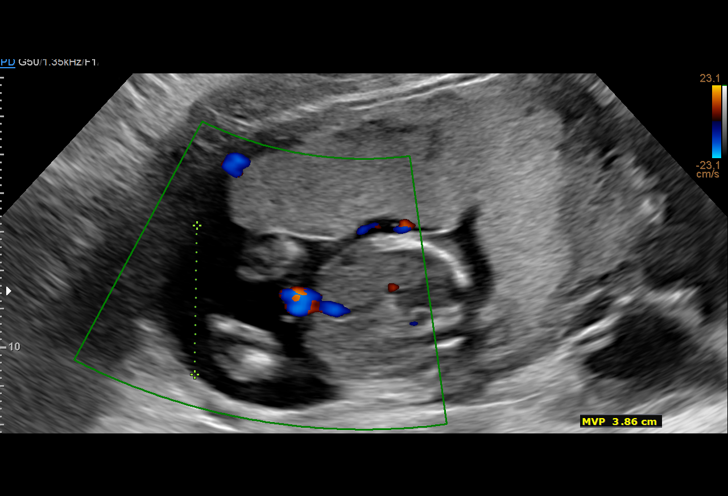
[im 16/86]
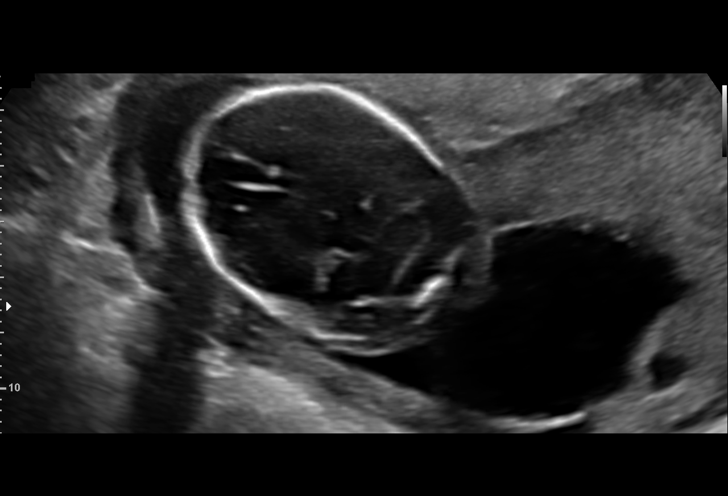
[im 23/86]
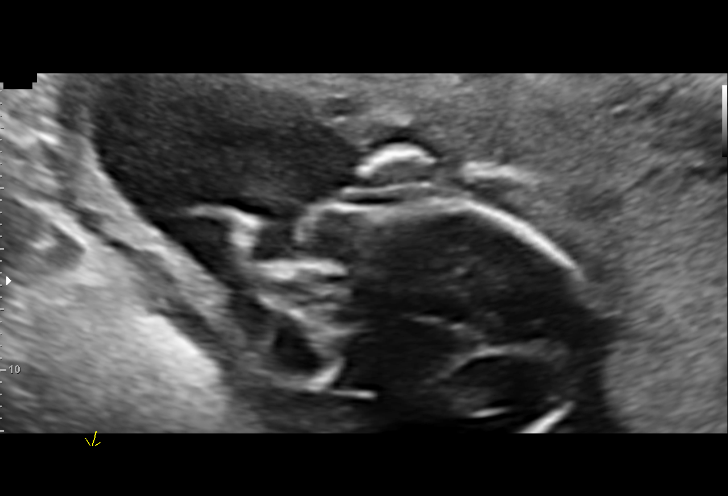
[im 29/86]
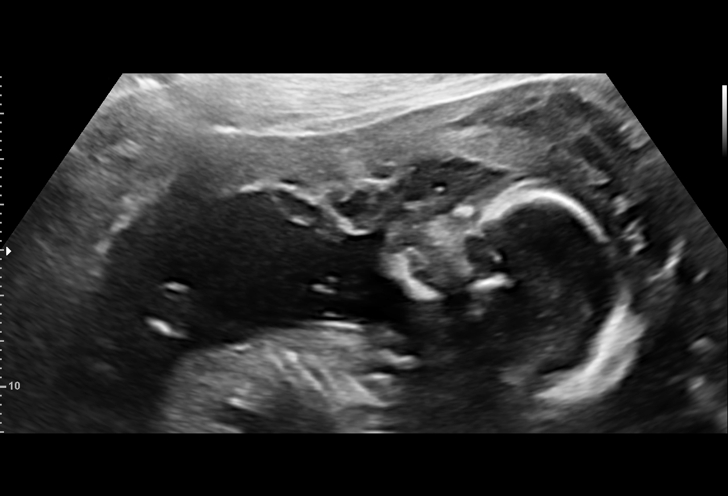
[im 35/86]
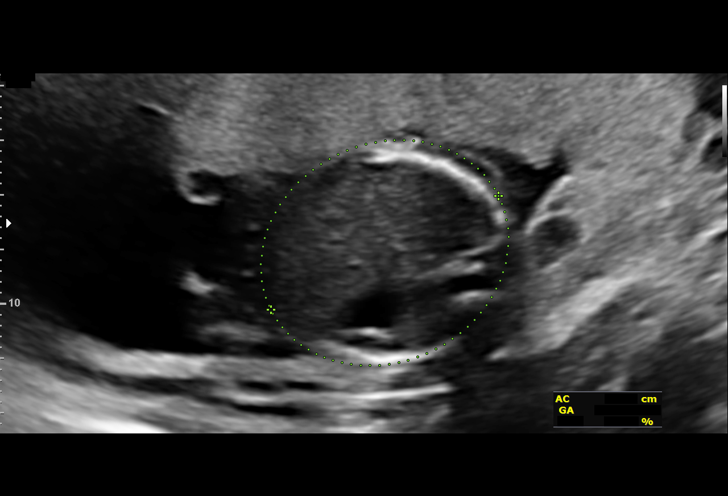
[im 45/86]
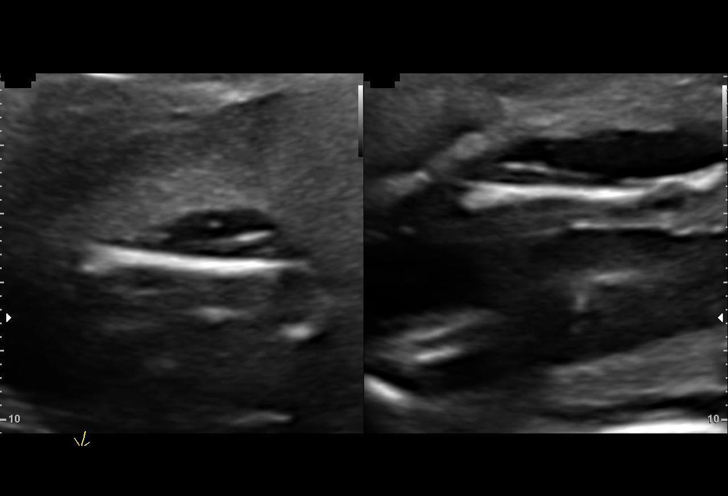
[im 51/86]
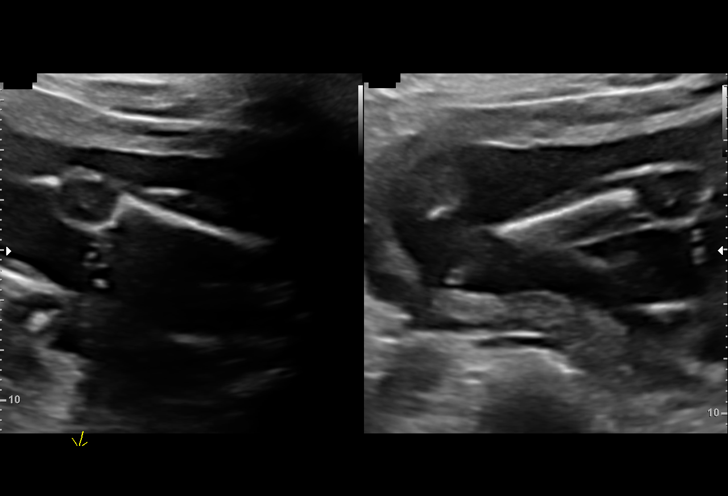
[im 57/86]
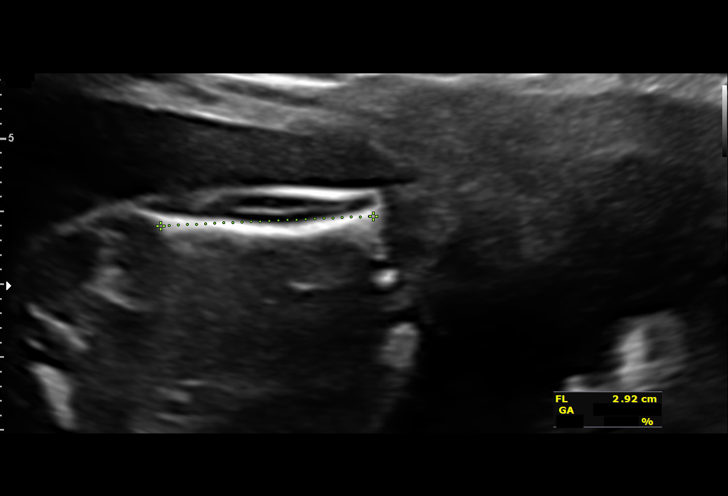
[im 63/86]
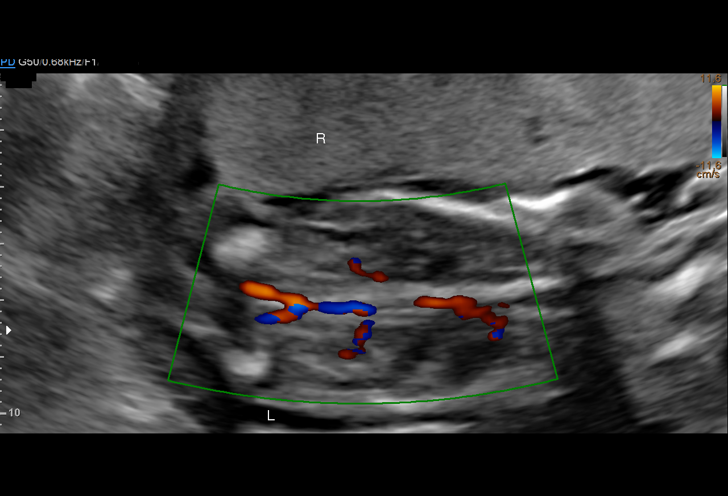
[im 70/86]
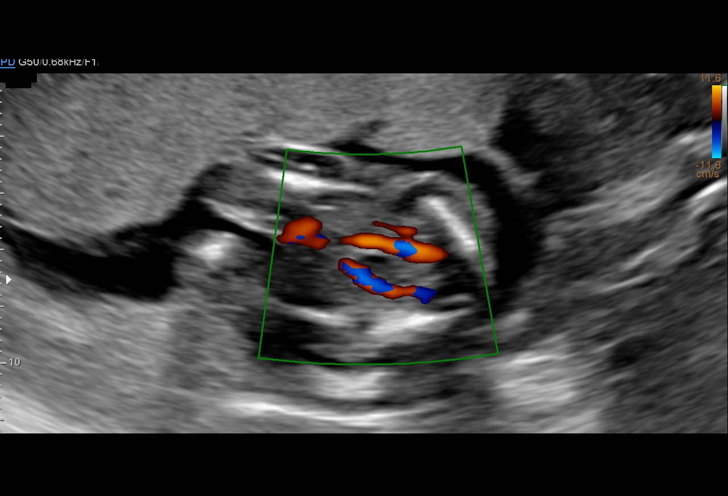
[im 76/86]
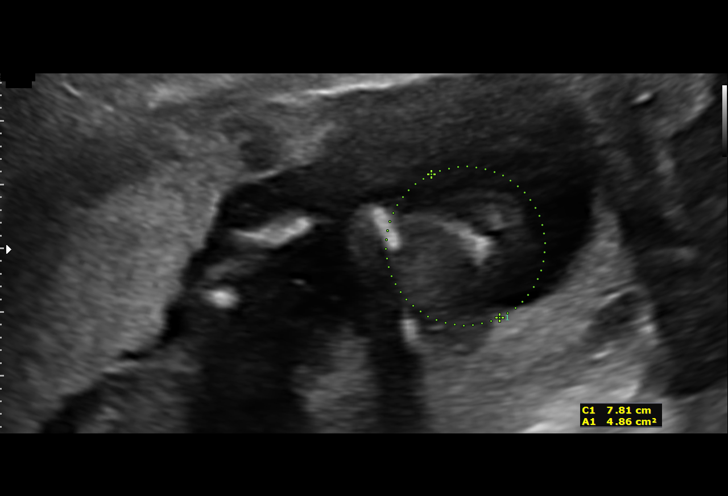
[im 82/86]
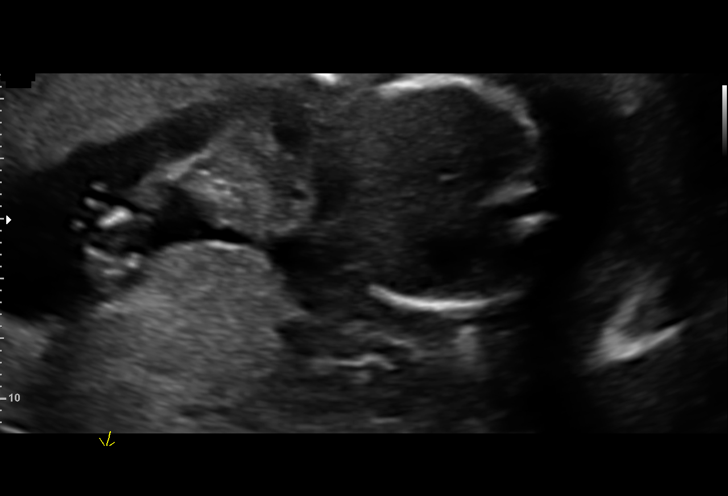

[13 of 28 positions shown; findings below may reference images not displayed]

[REDACTED]care
                   WALKER CNM

Indications

 Obesity complicating pregnancy, second
 trimester (BMI 35)
 Encounter for antenatal screening for
 malformations
 LR NIPS, Neg AFP, Neg Horizon
 19 weeks gestation of pregnancy
Fetal Evaluation

 Num Of Fetuses:         1
 Fetal Heart Rate(bpm):  169
 Cardiac Activity:       Observed
 Presentation:           Variable
 Placenta:               Anterior
 P. Cord Insertion:      Visualized, central

 Amniotic Fluid
 AFI FV:      Within normal limits

                             Largest Pocket(cm)

Biometry

 BPD:      40.9  mm     G. Age:  18w 3d         20  %    CI:        68.98   %    70 - 86
                                                         FL/HC:      18.7   %    16.1 -
 HC:      157.3  mm     G. Age:  18w 4d         19  %    HC/AC:      1.14        1.09 -
 AC:      137.4  mm     G. Age:  19w 1d         46  %    FL/BPD:     71.9   %
 FL:       29.4  mm     G. Age:  19w 0d         40  %    FL/AC:      21.4   %    20 - 24
 HUM:      30.4  mm     G. Age:  20w 0d         74  %
 CER:      19.7  mm     G. Age:  19w 1d         40  %
 NFT:       4.9  mm

 LV:        6.6  mm
 CM:        4.2  mm

 Est. FW:     271  gm    0 lb 10 oz      40  %
OB History

 Gravidity:    2
 Living:       1
Gestational Age

 LMP:           19w 1d        Date:  02/18/21                  EDD:   11/25/21
 U/S Today:     18w 6d                                        EDD:   11/27/21
 Best:          19w 1d     Det. By:  LMP  (02/18/21)          EDD:   11/25/21
Anatomy

 Cranium:               Appears normal         Aortic Arch:            Not well visualized
 Cavum:                 Appears normal         Ductal Arch:            Not well visualized
 Ventricles:            Appears normal         Diaphragm:              Appears normal
 Choroid Plexus:        Appears normal         Stomach:                Appears normal, left
                                                                       sided
 Cerebellum:            Appears normal         Abdomen:                Appears normal
 Posterior Fossa:       Appears normal         Abdominal Wall:         Appears nml (cord
                                                                       insert, abd wall)
 Nuchal Fold:           Appears normal         Cord Vessels:           Appears normal (3
                                                                       vessel cord)
 Face:                  Appears normal         Kidneys:                Appear normal
                        (orbits and profile)
 Lips:                  Appears normal         Bladder:                Appears normal
 Thoracic:              Appears normal         Spine:                  Ltd views no
                                                                       intracranial signs of
                                                                       NTD
 Heart:                 Not well visualized    Upper Extremities:      Visualized
 RVOT:                  Not well visualized    Lower Extremities:      Visualized
 LVOT:                  Not well visualized

 Other:  Hands and feet visualized. Nasal bone and lenses visualized. Fetus
         appears to be female. Technically difficult due to maternal habitus
         and fetal position.
Cervix Uterus Adnexa

 Cervix
 Length:           3.68  cm.
 Normal appearance by transabdominal scan.

 Uterus
 No abnormality visualized.

 Right Ovary
 Not visualized.
 Left Ovary
 Not visualized.

 Cul De Sac
 No free fluid seen.

 Adnexa
 No adnexal mass visualized.
Impression

 G2 P1. Patient is here for fetal anatomy scan.
 On cell-free fetal DNA screening, the risks of fetal
 aneuploidies are not increased .MSAFP screening showed
 low risk for open-neural tube defects .

 Obstetric history is significant for a term vaginal delivery.

 We performed fetal anatomy scan. No makers of
 aneuploidies or fetal structural defects are seen. Fetal
 biometry is consistent with her previously-established dates.
 Amniotic fluid is normal and good fetal activity is seen.
 Patient understands the limitations of ultrasound in detecting
 fetal anomalies.
 As maternal obesity limits resolution of images, failure to
 detect anomalies are more common .
Recommendations

 -An appointment was made for her to return in 4 weeks for
 completion of fetal anatomy.
                [place], [place]

## 2023-11-26 ENCOUNTER — Other Ambulatory Visit: Payer: Self-pay | Admitting: Advanced Practice Midwife

## 2023-11-26 DIAGNOSIS — Z3041 Encounter for surveillance of contraceptive pills: Secondary | ICD-10-CM

## 2024-01-06 ENCOUNTER — Ambulatory Visit (INDEPENDENT_AMBULATORY_CARE_PROVIDER_SITE_OTHER)

## 2024-01-06 DIAGNOSIS — Z789 Other specified health status: Secondary | ICD-10-CM

## 2024-01-06 DIAGNOSIS — Z3201 Encounter for pregnancy test, result positive: Secondary | ICD-10-CM

## 2024-01-06 LAB — POCT PREGNANCY, URINE: Preg Test, Ur: POSITIVE — AB

## 2024-01-06 NOTE — Patient Instructions (Addendum)
Safe Medications in Pregnancy  ? ?Acne:  ?Benzoyl Peroxide  ?Salicylic Acid  ? ?Backache/Headache:  ?Tylenol: 2 regular strength every 4 hours OR  ?             2 Extra strength every 6 hours  ? ?Colds/Coughs/Allergies:  ?Benadryl (alcohol free) 25 mg every 6 hours as needed  ?Breath right strips  ?Claritin  ?Cepacol throat lozenges  ?Chloraseptic throat spray  ?Cold-Eeze- up to three times per day  ?Cough drops, alcohol free  ?Flonase (by prescription only)  ?Guaifenesin  ?Mucinex  ?Robitussin DM (plain only, alcohol free)  ?Saline nasal spray/drops  ?Sudafed (pseudoephedrine) & Actifed * use only after [redacted] weeks gestation and if you do not have high blood pressure  ?Tylenol  ?Vicks Vaporub  ?Zinc lozenges  ?Zyrtec  ? ?Constipation:  ?Colace  ?Ducolax suppositories  ?Fleet enema  ?Glycerin suppositories  ?Metamucil  ?Milk of magnesia  ?Miralax  ?Senokot  ?Smooth move tea  ? ?Diarrhea:  ?Kaopectate  ?Imodium A-D  ? ?*NO pepto Bismol  ? ?Hemorrhoids:  ?Anusol  ?Anusol HC  ?Preparation H  ?Tucks  ? ?Indigestion:  ?Tums  ?Maalox  ?Mylanta  ?Zantac  ?Pepcid  ? ?Insomnia:  ?Benadryl (alcohol free) 25mg every 6 hours as needed  ?Tylenol PM  ?Unisom, no Gelcaps  ? ?Leg Cramps:  ?Tums  ?MagGel  ? ?Nausea/Vomiting:  ?Bonine  ?Dramamine  ?Emetrol  ?Ginger extract  ?Sea bands  ?Meclizine  ?Nausea medication to take during pregnancy:  ?Unisom (doxylamine succinate 25 mg tablets) Take one tablet daily at bedtime. If symptoms are not adequately controlled, the dose can be increased to a maximum recommended dose of two tablets daily (1/2 tablet in the morning, 1/2 tablet mid-afternoon and one at bedtime).  ?Vitamin B6 100mg tablets. Take one tablet twice a day (up to 200 mg per day).  ? ?Skin Rashes:  ?Aveeno products  ?Benadryl cream or 25mg every 6 hours as needed  ?Calamine Lotion  ?1% cortisone cream  ? ?Yeast infection:  ?Gyne-lotrimin 7  ?Monistat 7  ? ? ?**If taking multiple medications, please check labels to avoid  duplicating the same active ingredients  ?**take medication as directed on the label  ?** Do not exceed 4000 mg of tylenol in 24 hours  ?**Do not take medications that contain aspirin or ibuprofen  ?    ?   ?  ?[facility] Prenatal Care Providers ?         ?[facility] locations:  ?Hours may vary. Please call for an appointment ? ?[facility] at [facility]             ?[address], [place], [place] [address] ?[phone] ? ?[facility] at [facility]                                                             ?[address], [address], [place], [place], [address] ?[phone] ? ?[facility] at [facility]                                    ?[address], [address], , [place], [address] ?[phone] ? ?[facility] at [place] ?[address], [address], [place], [place], [address] ?[phone] ? ?[facility] at [facility]                                 ?[address], [place], [place], [address] ?[phone] ? ?[facility] at [facility]                                    ?[address], [place], [place], [address] ?[phone] ? ?[facility] at [facility] ?[address], [address], [place], [place], [address]                             ?  ?

## 2024-01-06 NOTE — Progress Notes (Signed)
 Kylie Wallace here in office today to drop off urine for pregnancy test. UPT result was positive. I called [name] to notify her of the positive test result. [name] states she is unsure about her LMP. Patient states she stopped taking Slynd  2 months ago.   I reviewed her allergies, medications, OB history, and medical history. I sent the patient a list of safe medications to take during pregnancy. I advised the patient to begin prenatal care. Patient denies vaginal bleeding and pain. I reviewed MAU precautions and she verbalized understanding. The patient voices no further questions or concerns.   [place], RN 01/06/24

## 2024-01-12 ENCOUNTER — Other Ambulatory Visit

## 2024-01-14 ENCOUNTER — Ambulatory Visit (INDEPENDENT_AMBULATORY_CARE_PROVIDER_SITE_OTHER)

## 2024-01-14 ENCOUNTER — Other Ambulatory Visit: Payer: Self-pay | Admitting: Physician Assistant

## 2024-01-14 ENCOUNTER — Other Ambulatory Visit: Payer: Self-pay

## 2024-01-14 DIAGNOSIS — Z3201 Encounter for pregnancy test, result positive: Secondary | ICD-10-CM

## 2024-01-14 DIAGNOSIS — Z3A09 9 weeks gestation of pregnancy: Secondary | ICD-10-CM | POA: Diagnosis not present

## 2024-01-14 DIAGNOSIS — Z789 Other specified health status: Secondary | ICD-10-CM

## 2024-01-14 DIAGNOSIS — Z3491 Encounter for supervision of normal pregnancy, unspecified, first trimester: Secondary | ICD-10-CM | POA: Diagnosis not present

## 2024-02-26 ENCOUNTER — Telehealth

## 2024-02-26 DIAGNOSIS — Z3480 Encounter for supervision of other normal pregnancy, unspecified trimester: Secondary | ICD-10-CM | POA: Insufficient documentation

## 2024-02-26 DIAGNOSIS — Z3490 Encounter for supervision of normal pregnancy, unspecified, unspecified trimester: Secondary | ICD-10-CM | POA: Insufficient documentation

## 2024-02-26 DIAGNOSIS — Z3482 Encounter for supervision of other normal pregnancy, second trimester: Secondary | ICD-10-CM

## 2024-02-26 DIAGNOSIS — Z3A15 15 weeks gestation of pregnancy: Secondary | ICD-10-CM

## 2024-02-26 MED ORDER — GOJJI WEIGHT SCALE MISC: 1.0000 | 0 refills | Status: AC | PRN

## 2024-02-26 NOTE — Progress Notes (Signed)
 New OB Intake  I connected with Kylie Wallace  on 02/26/2024 at  8:15 AM EST by MyChart Video Visit and verified that I am speaking with the correct person using two identifiers. Nurse is located at [facility] and pt is located at home.  I discussed the limitations, risks, security and privacy concerns of performing an evaluation and management service by telephone and the availability of in person appointments. I also discussed with the patient that there may be a patient responsible charge related to this service. The patient expressed understanding and agreed to proceed.  I explained I am completing New OB Intake today. We discussed EDD of 08/14/2024 based on US  at [redacted]w[redacted]d weeks. Pt is G3P2002. I reviewed her allergies, medications and Medical/Surgical/OB history.    Patient Active Problem List   Diagnosis Date Noted   Supervision of other normal pregnancy, antepartum 02/26/2024   Chronic cholecystitis with calculus 11/11/2022   Obesity, Class II, BMI 35-39.9 11/04/2022     Concerns addressed today  Delivery Plans Plans to deliver at [facility]. Discussed the nature of our practice with multiple providers including residents and students as well as female and female providers. Due to the size of the practice, the delivering provider may not be the same as those providing prenatal care.   Patient is interested in water birth.  MyChart/Babyscripts MyChart access verified. I explained pt will have some visits in office and some virtually. Babyscripts instructions given and order placed. Patient verifies receipt of registration text/e-mail. Account successfully created and app downloaded. If patient is a candidate for Optimized scheduling, add to sticky note.   Blood Pressure Cuff/Weight Scale Blood pressure cuff ordered for patient to pick-up from [employer]. Explained after first prenatal appt pt will check weekly and document in Babyscripts. Patient does not have weight scale; order sent to  [facility], patient may track weight weekly in Babyscripts.  Anatomy US  Sent a staff message to MFM Clerical Pool to schedule.  Is patient a CenteringPregnancy candidate?  Declined Declined due to Childcare  Is patient a Mom+Baby Combined Care candidate?  Not a candidate   If accepted, confirm patient does not intend to move from the area for at least 12 months, then notify Mom+Baby staff  Is patient a candidate for Babyscripts Optimization? Yes, patient declined   First visit review I reviewed new OB appt with patient. Explained pt will be seen by Cornell Finder CNM at first visit. Discussed [place] genetic screening with patient. No Panorama Routine prenatal labs is not   Last Pap Diagnosis  Date Value Ref Range Status  05/09/2021   Final   - Negative for intraepithelial lesion or malignancy (NILM)    Kylie Wallace, CMA 02/26/2024  8:31 AM

## 2024-03-03 ENCOUNTER — Ambulatory Visit (INDEPENDENT_AMBULATORY_CARE_PROVIDER_SITE_OTHER): Admitting: Certified Nurse Midwife

## 2024-03-03 ENCOUNTER — Other Ambulatory Visit: Payer: Self-pay

## 2024-03-03 ENCOUNTER — Other Ambulatory Visit (HOSPITAL_COMMUNITY)
Admission: RE | Admit: 2024-03-03 | Discharge: 2024-03-03 | Disposition: A | Discharge status: Home / Self Care | Source: Ambulatory Visit | Attending: Certified Nurse Midwife | Admitting: Certified Nurse Midwife

## 2024-03-03 ENCOUNTER — Encounter: Payer: Self-pay | Admitting: Certified Nurse Midwife

## 2024-03-03 VITALS — BP 123/82 | HR 93 | Wt 233.8 lb

## 2024-03-03 DIAGNOSIS — Z9049 Acquired absence of other specified parts of digestive tract: Secondary | ICD-10-CM | POA: Diagnosis not present

## 2024-03-03 DIAGNOSIS — O0932 Supervision of pregnancy with insufficient antenatal care, second trimester: Secondary | ICD-10-CM

## 2024-03-03 DIAGNOSIS — O219 Vomiting of pregnancy, unspecified: Secondary | ICD-10-CM

## 2024-03-03 DIAGNOSIS — Z3480 Encounter for supervision of other normal pregnancy, unspecified trimester: Secondary | ICD-10-CM | POA: Diagnosis present

## 2024-03-03 DIAGNOSIS — Z3A16 16 weeks gestation of pregnancy: Secondary | ICD-10-CM

## 2024-03-03 DIAGNOSIS — Z23 Encounter for immunization: Secondary | ICD-10-CM

## 2024-03-03 MED ORDER — DOXYLAMINE-PYRIDOXINE 10-10 MG PO TBEC: 2.0000 | DELAYED_RELEASE_TABLET | Freq: Every day | ORAL | 2 refills | Status: AC

## 2024-03-03 MED ORDER — ONDANSETRON 4 MG PO TBDP: 4.0000 mg | ORAL_TABLET | Freq: Three times a day (TID) | ORAL | 0 refills | Status: AC | PRN

## 2024-03-03 NOTE — Progress Notes (Unsigned)
 History:   Kylie Wallace is a 23 y.o. G3P2002 at [redacted]w[redacted]d by {Ob dating:14516} being seen today for her first obstetrical visit.  Her obstetrical history is significant for {ob risk factors:10154}. Patient {does/does not:19097} intend to breast feed. Pregnancy history fully reviewed.  Patient reports {sx:14538}.      HISTORY: OB History  Gravida Para Term Preterm AB Living  3 2 2  0 0 2  SAB IAB Ectopic Multiple Live Births  0 0 0 0 2    # Outcome Date GA Lbr Len/2nd Weight Sex Type Anes PTL Lv  3 Current           2 Term 11/19/21 [redacted]w[redacted]d / 00:07 8 lb 10.5 oz (3.925 kg) F Vag-Spont None  LIV     Name: Kylie Wallace     Apgar1: 8  Apgar5: 9  1 Term 10/10/18 [redacted]w[redacted]d 08:34 / 00:10 6 lb 11.4 oz (3.045 kg) M Vag-Spont Local  LIV     Birth Comments: moulding     Name: Kylie Wallace     Apgar1: 8  Apgar5: 9    Last pap smear was done *** and was {Normal/Abnormal Appearance:21344::normal}  Past Medical History:  Diagnosis Date   Medical history non-contributory    Steatohepatitis, 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 Physical Exam:   Vitals:   03/03/24 0943  BP: 123/82  Pulse: 93  Weight: 233 lb 12.8 oz (106.1 kg)   Fetal Heart Rate (bpm): 156  Constitutional: Well-developed, well-nourished pregnant female in no acute distress.  HEENT: PERRLA Skin: normal color and turgor, no rash Cardiovascular: normal rate &  rhythm, warm and well perfused Respiratory: normal effort, no problems with respiration noted GI: Abd soft, non-distended MS: Extremities nontender, no edema, normal ROM Neurologic: Alert and oriented x 4.  GU: no CVA tenderness Pelvic: NEFG, physiologic discharge, no blood, cervix clean. ***Pap/swabs collected ***Exam deferred  Assessment:    Pregnancy: [id number] Patient Active Problem List   Diagnosis Date Noted   Supervision of other normal pregnancy, antepartum 02/26/2024   Chronic cholecystitis with calculus 11/11/2022   Obesity, Class II, BMI 35-39.9 11/04/2022     Plan:    1. Supervision of other normal pregnancy, antepartum (Primary) *** - CBC/D/Plt+RPR+Rh+ABO+RubIgG... - HgB A1c - PANORAMA PRENATAL TEST - AFP, Serum, Open Spina Bifida - Cervicovaginal ancillary only( [place]) - Culture, OB Urine - Flu vaccine trivalent PF, 6mos and older(Flulaval,Afluria,Fluarix,Fluzone)  2. [redacted] weeks gestation of pregnancy ***  3. Nausea and vomiting during pregnancy *** - Doxylamine -Pyridoxine  (DICLEGIS ) 10-10 MG TBEC; Take 2 tablets by mouth at bedtime. May add 1 tablet at breakfast and 1 tablet at lunch if needed.  Dispense: 100 tablet; Refill: 2 - ondansetron  (ZOFRAN -ODT) 4 MG disintegrating tablet; Take 1 tablet (4 mg total) by mouth every 8 (eight) hours as needed for nausea or vomiting.  Dispense: 20 tablet; Refill: 0  4. Initial obstetric visit in second trimester ***   - Initial labs drawn. - Continue prenatal vitamins. - Problem list reviewed and updated. - Genetic Screening discussed, {Blank multiple:19196::First trimester screen,Quad screen,NIPS}: {requests/ordered/declines:14581}. - Ultrasound discussed; fetal anatomic survey: {requests/ordered/declines:14581}. - Anticipatory guidance about prenatal visits given including labs, ultrasounds, and testing. - Discussed usage of Babyscripts and virtual visits as additional source of managing and completing  prenatal visits in midst of coronavirus and pandemic.   - Encouraged to complete MyChart Registration for her ability to review results, send requests, and have questions addressed.  - The nature of [place] [facility] with multiple MDs and Advanced Practice Providers was explained to patient; also emphasized that residents, students are part of our team. - Routine obstetric precautions reviewed. Encouraged to seek out care at office or emergency room [facility] preferred) for urgent and/or emergent concerns.  No follow-ups on file.    Future Appointments  Date Time Provider Department Center  03/31/2024  9:15 AM Vannie Cornell JONELLE EDDY [facility]  04/09/2024  1:00 PM [facility] PROVIDER 1 [facility]  04/09/2024  1:30 PM [facility] US3 [facility]  04/28/2024  9:15 AM Vannie Cornell JONELLE, CNM [facility]    Cornell Vannie, MSN, CNM, IBCLC Certified Nurse Midwife, [facility]      [1]  Social History Tobacco Use   Smoking status: Never   Smokeless tobacco: Never  Vaping Use   Vaping status: Never Used  Substance Use Topics   Alcohol use: No   Drug use: No  [2] No Known Allergies [3]  Current Outpatient Medications on File Prior to Visit  Medication Sig Dispense Refill   Blood Pressure Monitoring (BLOOD PRESSURE KIT) DEVI 1 Device by Does not apply route as needed. 1 each 0   Misc. Devices (GOJJI WEIGHT SCALE) MISC 1 Device by Does not apply route as needed. 1 each 0   Prenatal Vit-Fe Fumarate-FA (PRENATAL PO) Take by mouth.     [DISCONTINUED] norethindrone  (MICRONOR ) 0.35 MG tablet Take 1 tablet (0.35 mg total) by mouth daily. 1 Package 12   No current facility-administered medications on file prior to visit.

## 2024-03-03 NOTE — Patient Instructions (Signed)
 [facility] [address], [place], [place] [address] [phone] Hours: Sunday Closed Monday 9AM-6PM Tuesday 9AM-6PM Wednesday 9AM-6PM Thursday 9AM-6PM Friday           9AM-6PM Saturday         10 AM-1PM

## 2024-03-04 LAB — CERVICOVAGINAL ANCILLARY ONLY
Chlamydia: NEGATIVE
Comment: NEGATIVE
Comment: NORMAL
Neisseria Gonorrhea: NEGATIVE

## 2024-03-05 LAB — AFP, SERUM, OPEN SPINA BIFIDA
AFP MoM: 1.65
AFP Value: 43.7 ng/mL
Gest. Age on Collection Date: 16.4 wk
Maternal Age At EDD: 26.3 a
OSBR Risk 1 IN: 1848
Test Results:: NEGATIVE
Weight: 234 [lb_av]

## 2024-03-05 LAB — CBC/D/PLT+RPR+RH+ABO+RUBIGG...
Basophils Absolute: 0 x10E3/uL (ref 0.0–0.2)
Basos: 0 %
EOS (ABSOLUTE): 0 x10E3/uL (ref 0.0–0.4)
Eos: 0 %
HCV Ab: NONREACTIVE
HIV Screen 4th Generation wRfx: NONREACTIVE
Hematocrit: 39.5 % (ref 34.0–46.6)
Hemoglobin: 12.7 g/dL (ref 11.1–15.9)
Hepatitis B Surface Ag: NEGATIVE
Immature Grans (Abs): 0.1 x10E3/uL (ref 0.0–0.1)
Immature Granulocytes: 1 %
Lymphocytes Absolute: 2.1 x10E3/uL (ref 0.7–3.1)
Lymphs: 22 %
MCH: 29.3 pg (ref 26.6–33.0)
MCHC: 32.2 g/dL (ref 31.5–35.7)
MCV: 91 fL (ref 79–97)
Monocytes Absolute: 0.4 x10E3/uL (ref 0.1–0.9)
Monocytes: 4 %
Neutrophils Absolute: 7.1 x10E3/uL — ABNORMAL HIGH (ref 1.4–7.0)
Neutrophils: 73 %
Platelets: 212 x10E3/uL (ref 150–450)
RBC: 4.34 x10E6/uL (ref 3.77–5.28)
RDW: 12.9 % (ref 11.7–15.4)
RPR Ser Ql: NONREACTIVE
Rh Factor: POSITIVE
Rubella Antibodies, IGG: 1.05 {index}
WBC: 9.7 x10E3/uL (ref 3.4–10.8)

## 2024-03-05 LAB — HCV INTERPRETATION

## 2024-03-05 LAB — AB SCR+ANTIBODY ID: Antibody Screen: POSITIVE — AB

## 2024-03-05 LAB — CULTURE, OB URINE

## 2024-03-05 LAB — URINE CULTURE, OB REFLEX

## 2024-03-05 LAB — HEMOGLOBIN A1C
Est. average glucose Bld gHb Est-mCnc: 103 mg/dL
Hgb A1c MFr Bld: 5.2 % (ref 4.8–5.6)

## 2024-03-08 ENCOUNTER — Encounter: Payer: Self-pay | Admitting: General Practice

## 2024-03-08 LAB — PANORAMA PRENATAL TEST FULL PANEL:PANORAMA TEST PLUS 5 ADDITIONAL MICRODELETIONS: FETAL FRACTION: 9.8

## 2024-03-09 DIAGNOSIS — Z9049 Acquired absence of other specified parts of digestive tract: Secondary | ICD-10-CM | POA: Insufficient documentation

## 2024-03-31 ENCOUNTER — Other Ambulatory Visit: Payer: Self-pay

## 2024-03-31 ENCOUNTER — Ambulatory Visit: Payer: Self-pay | Admitting: Certified Nurse Midwife

## 2024-03-31 VITALS — BP 129/79 | HR 90 | Wt 236.6 lb

## 2024-03-31 DIAGNOSIS — Z3A20 20 weeks gestation of pregnancy: Secondary | ICD-10-CM

## 2024-03-31 DIAGNOSIS — Z3492 Encounter for supervision of normal pregnancy, unspecified, second trimester: Secondary | ICD-10-CM

## 2024-03-31 NOTE — Patient Instructions (Addendum)
" °  Considering Waterbirth? Guide for patients at [facility]) Why consider waterbirth? Gentle birth for babies  Less pain medicine used in labor  May allow for passive descent/less pushing  May reduce perineal tears  More mobility and instinctive maternal position changes  Increased maternal relaxation   Is waterbirth safe? What are the risks of infection, drowning or other complications? Infection:  Very low risk (3.7 % for tub vs 4.8% for bed)  7 in 8000 waterbirths with documented infection  Poorly cleaned equipment most common cause  Slightly lower group B strep transmission rate  Drowning  Maternal:  Very low risk  Related to seizures or fainting  Newborn:  Very low risk. No evidence of increased risk of respiratory problems in multiple large studies  Physiological protection from breathing under water  Avoid underwater birth if there are any fetal complications  Once baby's head is out of the water, keep it out.  Birth complication  Some reports of cord trauma, but risk decreased by bringing baby to surface gradually  No evidence of increased risk of shoulder dystocia. Mothers can usually change positions faster in water than in a bed, possibly aiding the maneuvers to free the shoulder.   There are 2 things you MUST do to have a waterbirth with [facility]: Attend a waterbirth class at [employer] [facility] [employer] at [email or web address] or [email or web address] or by calling [phone] Bring us  the certificate from the class to your prenatal appointment or send via MyChart Meet with a midwife  Helpful information: You may want to bring a bathing suit top to the hospital to wear during labor but this is optional.  All other supplies are provided by the hospital. Please arrive at the hospital with signs of active labor, and do not wait at home until late in labor. It takes 45 min- 1 hour for fetal monitoring, and check  in to your room to take place, plus transport and filling of the waterbirth tub.    Things that would prevent you from having a waterbirth: Premature, <37wks  Previous cesarean birth  Presence of thick meconium-stained fluid  Multiple gestation (Twins, triplets, etc.)  Uncontrolled diabetes or gestational diabetes requiring medication  Hypertension diagnosed in pregnancy or preexisting hypertension (gestational hypertension, preeclampsia, or chronic hypertension) Fetal growth restriction (your baby measures less than 10th percentile on ultrasound) Heavy vaginal bleeding  Non-reassuring fetal heart rate  Active infection (MRSA, etc.). Group B Strep is NOT a contraindication for waterbirth.  If your labor has to be induced and induction method requires continuous monitoring of the baby's heart rate  Other risks/issues identified by your obstetrical provider   Please remember that birth is unpredictable. Under certain unforeseeable circumstances your provider may advise against giving birth in the tub. These decisions will be made on a case-by-case basis and with the safety of you and your baby as our highest priority.  Updated 06/20/21 "

## 2024-03-31 NOTE — Progress Notes (Addendum)
 Pt reports Pain under breast bone on left side, it comes & goes.

## 2024-04-02 NOTE — Progress Notes (Signed)
 "  PRENATAL VISIT NOTE  Subjective:  Kylie Wallace is a 24 y.o. G3P2002 at [redacted]w[redacted]d being seen today for ongoing prenatal care.  She is currently monitored for the following issues for this low-risk pregnancy and has Obesity, Class II, BMI 35-39.9; Supervision of low-risk pregnancy; and History of cholecystectomy on their problem list.  Patient reports occasional intermittent pain under left breast bone, never severe and eases without intervention.  Contractions: Not present. Vag. Bleeding: None.  Movement: Present. Denies leaking of fluid.   The following portions of the patient's history were reviewed and updated as appropriate: allergies, current medications, past family history, past medical history, past social history, past surgical history and problem list.   Objective:   Vitals:   03/31/24 0926  BP: 129/79  Pulse: 90  Weight: 236 lb 9.6 oz (107.3 kg)   Fetal Status:  Fetal Heart Rate (bpm): 169   Movement: Present    General: Alert, oriented and cooperative. Patient is in no acute distress.  Skin: Skin is warm and dry. No rash noted.   Cardiovascular: Normal heart rate noted  Respiratory: Normal respiratory effort, no problems with respiration noted  Abdomen: Soft, gravid, appropriate for gestational age.  Pain/Pressure: Present     Pelvic: Cervical exam deferred        Extremities: Normal range of motion.     Mental Status: Normal mood and affect. Normal behavior. Normal judgment and thought content.      03/03/2024   11:00 AM 01/22/2022    9:21 AM 11/28/2021    1:54 PM  Depression screen PHQ 2/9  Decreased Interest 1 0 0  Down, Depressed, Hopeless 0 0 0  PHQ - 2 Score 1 0 0  Altered sleeping 0 0 1  Tired, decreased energy 1 0 1  Change in appetite 1 0 0  Feeling bad or failure about yourself  0 0 0  Trouble concentrating 0 0 0  Moving slowly or fidgety/restless 0 0 0  Suicidal thoughts 0 0 0  PHQ-9 Score 3 0  2      Data saved with a previous flowsheet row  definition        03/03/2024   11:01 AM 01/22/2022    9:21 AM 11/28/2021    1:54 PM 11/01/2021   11:20 AM  GAD 7 : Generalized Anxiety Score  Nervous, Anxious, on Edge 0 0 0 0  Control/stop worrying 0 0 0 0  Worry too much - different things 0 0 0 0  Trouble relaxing 0 0 0 0  Restless 0 0 0 0  Easily annoyed or irritable 0 0 0 0  Afraid - awful might happen 0 0 0 0  Total GAD 7 Score 0 0 0 0   Assessment and Plan:  Pregnancy: G3P2002 at [redacted]w[redacted]d 1. Encounter for supervision of low-risk pregnancy in second trimester (Primary) - Doing well, feeling regular fetal movement  - Advised to let us  know if pain continues or gets more frequent/severe  2. [redacted] weeks gestation of pregnancy - Routine OB care    Preterm labor symptoms and general obstetric precautions including but not limited to vaginal bleeding, contractions, leaking of fluid and fetal movement were reviewed in detail with the patient. Please refer to After Visit Summary for other counseling recommendations.   Return in about 4 weeks (around 04/28/2024) for IN-PERSON, LOB.  Future Appointments  Date Time Provider Department Center  04/09/2024  1:00 PM [facility] PROVIDER 1 [facility]  04/09/2024  1:30 PM  [facility] US3 [facility]  04/28/2024  9:15 AM Vannie Cornell SAUNDERS, CNM [facility]  05/26/2024  8:20 AM [facility] LAB [facility]  05/26/2024  9:15 AM Vannie Cornell SAUNDERS, CNM [facility]  06/09/2024 10:55 AM Vannie Cornell SAUNDERS, CNM [facility]  06/23/2024  9:35 AM Vannie Cornell SAUNDERS, CNM [facility]  07/07/2024  9:35 AM Vannie Cornell SAUNDERS, CNM [facility]  07/21/2024 10:15 AM Vannie Cornell SAUNDERS, CNM [facility]  07/28/2024  9:35 AM Vannie Cornell SAUNDERS, CNM [facility]  08/04/2024  9:15 AM Vannie Cornell SAUNDERS EDDY [facility]  08/11/2024  9:35 AM Vannie Cornell SAUNDERS EDDY [facility]  08/18/2024  9:35 AM Vannie Cornell SAUNDERS, CNM [facility]    Cornell SAUNDERS Vannie, CNM  "

## 2024-04-09 ENCOUNTER — Ambulatory Visit: Attending: Certified Nurse Midwife

## 2024-04-09 ENCOUNTER — Ambulatory Visit (HOSPITAL_BASED_OUTPATIENT_CLINIC_OR_DEPARTMENT_OTHER): Admitting: Obstetrics

## 2024-04-09 VITALS — BP 115/64 | HR 95

## 2024-04-09 DIAGNOSIS — Z3A15 15 weeks gestation of pregnancy: Secondary | ICD-10-CM | POA: Diagnosis present

## 2024-04-09 DIAGNOSIS — E669 Obesity, unspecified: Secondary | ICD-10-CM

## 2024-04-09 DIAGNOSIS — Z3689 Encounter for other specified antenatal screening: Secondary | ICD-10-CM | POA: Diagnosis not present

## 2024-04-09 DIAGNOSIS — Z3A21 21 weeks gestation of pregnancy: Secondary | ICD-10-CM | POA: Insufficient documentation

## 2024-04-09 DIAGNOSIS — O99212 Obesity complicating pregnancy, second trimester: Secondary | ICD-10-CM | POA: Diagnosis present

## 2024-04-09 DIAGNOSIS — O321XX0 Maternal care for breech presentation, not applicable or unspecified: Secondary | ICD-10-CM | POA: Insufficient documentation

## 2024-04-09 DIAGNOSIS — Z363 Encounter for antenatal screening for malformations: Secondary | ICD-10-CM | POA: Diagnosis not present

## 2024-04-09 NOTE — Progress Notes (Signed)
 MFM Consult Note  Kylie Wallace is currently at [redacted]w[redacted]d. She was seen today for a detailed fetal anatomy scan due to maternal obesity with a BMI of 39.  She denies any significant past medical history and denies any problems in her current pregnancy.    She had a cell free DNA test earlier in her pregnancy which indicated a low risk for trisomy 36, 81, and 13. A female fetus is predicted.   Sonographic findings Single intrauterine pregnancy at 21w 6d. Fetal cardiac activity:  Observed and appears normal. Presentation: Breech. The views of the fetal anatomy were limited today due to maternal body habitus and the fetal position. Fetal biometry shows the estimated fetal weight of 1 lb, 456 grams (44%). Amniotic fluid: Within normal limits.  MVP: 4.07 cm. Placenta: Posterior. Adnexa: No abnormality visualized. Cervical length: 3.9 cm.  The patient was informed that anomalies may be missed due to technical limitations. If the fetus is in a suboptimal position or maternal habitus is increased, visualization of the fetus in the maternal uterus may be impaired.  The patient was advised that the views of the fetal anatomy were extremely limited today due to the fetal position.    Due to maternal obesity, we will continue to follow her with growth ultrasounds throughout her pregnancy.  She will return in 4 weeks for another ultrasound exam to complete the views of the fetal anatomy and to assess the fetal growth.  The patient stated that all of her questions were answered.   A total of 30 minutes was spent counseling, reviewing her chart, and coordinating the care for this patient.  Greater than 50% of the time was spent in direct face-to-face contact.

## 2024-04-28 ENCOUNTER — Encounter: Payer: Self-pay | Admitting: Certified Nurse Midwife

## 2024-05-26 ENCOUNTER — Other Ambulatory Visit: Payer: Self-pay

## 2024-05-26 ENCOUNTER — Encounter: Payer: Self-pay | Admitting: Certified Nurse Midwife

## 2024-06-09 ENCOUNTER — Encounter: Payer: Self-pay | Admitting: Certified Nurse Midwife

## 2024-06-23 ENCOUNTER — Encounter: Payer: Self-pay | Admitting: Certified Nurse Midwife

## 2024-07-07 ENCOUNTER — Encounter: Payer: Self-pay | Admitting: Certified Nurse Midwife

## 2024-07-21 ENCOUNTER — Encounter: Payer: Self-pay | Admitting: Certified Nurse Midwife

## 2024-07-28 ENCOUNTER — Encounter: Payer: Self-pay | Admitting: Certified Nurse Midwife

## 2024-08-04 ENCOUNTER — Encounter: Payer: Self-pay | Admitting: Certified Nurse Midwife

## 2024-08-11 ENCOUNTER — Encounter: Payer: Self-pay | Admitting: Certified Nurse Midwife

## 2024-08-18 ENCOUNTER — Encounter: Payer: Self-pay | Admitting: Certified Nurse Midwife
